# Patient Record
Sex: Female | Born: 1970 | Race: Black or African American | Hispanic: No | Marital: Single | Smoking: Never smoker
Health system: Southern US, Community
[De-identification: ages and names within clinical notes are randomized; demographics above are authoritative.]

## PROBLEM LIST (undated history)

## (undated) DIAGNOSIS — I10 Essential (primary) hypertension: Secondary | ICD-10-CM

## (undated) DIAGNOSIS — I7100 Dissection of unspecified site of aorta: Secondary | ICD-10-CM

## (undated) HISTORY — PX: CARDIAC SURGERY: SHX584

## (undated) HISTORY — PX: LAPAROSCOPIC GASTRIC SLEEVE RESECTION: SHX5895

## (undated) HISTORY — PX: FOOT SURGERY: SHX648

---

## 1998-02-04 ENCOUNTER — Other Ambulatory Visit: Admission: RE | Admit: 1998-02-04 | Discharge: 1998-02-04 | Payer: Self-pay | Admitting: *Deleted

## 1999-09-28 ENCOUNTER — Other Ambulatory Visit: Admission: RE | Admit: 1999-09-28 | Discharge: 1999-09-28 | Payer: Self-pay | Admitting: Obstetrics & Gynecology

## 1999-10-19 ENCOUNTER — Inpatient Hospital Stay (HOSPITAL_COMMUNITY): Admission: AD | Admit: 1999-10-19 | Discharge: 1999-10-19 | Payer: Self-pay | Admitting: Obstetrics & Gynecology

## 1999-10-19 ENCOUNTER — Encounter: Payer: Self-pay | Admitting: Obstetrics & Gynecology

## 2000-06-19 ENCOUNTER — Emergency Department (HOSPITAL_COMMUNITY): Admission: EM | Admit: 2000-06-19 | Discharge: 2000-06-19 | Payer: Self-pay | Admitting: Emergency Medicine

## 2001-02-13 ENCOUNTER — Other Ambulatory Visit: Admission: RE | Admit: 2001-02-13 | Discharge: 2001-02-13 | Payer: Self-pay | Admitting: *Deleted

## 2001-09-07 ENCOUNTER — Emergency Department (HOSPITAL_COMMUNITY): Admission: EM | Admit: 2001-09-07 | Discharge: 2001-09-07 | Payer: Self-pay

## 2004-10-07 ENCOUNTER — Emergency Department: Payer: Self-pay | Admitting: Unknown Physician Specialty

## 2006-06-19 ENCOUNTER — Emergency Department: Payer: Self-pay | Admitting: Emergency Medicine

## 2007-01-31 ENCOUNTER — Emergency Department: Payer: Self-pay | Admitting: Emergency Medicine

## 2009-01-09 ENCOUNTER — Emergency Department: Payer: Self-pay | Admitting: Unknown Physician Specialty

## 2009-01-23 ENCOUNTER — Emergency Department: Payer: Self-pay | Admitting: Emergency Medicine

## 2016-03-30 ENCOUNTER — Emergency Department: Payer: Self-pay

## 2016-03-30 ENCOUNTER — Encounter: Payer: Self-pay | Admitting: Emergency Medicine

## 2016-03-30 ENCOUNTER — Emergency Department
Admission: EM | Admit: 2016-03-30 | Discharge: 2016-03-30 | Payer: Self-pay | Attending: Emergency Medicine | Admitting: Emergency Medicine

## 2016-03-30 DIAGNOSIS — I10 Essential (primary) hypertension: Secondary | ICD-10-CM | POA: Insufficient documentation

## 2016-03-30 DIAGNOSIS — Z79899 Other long term (current) drug therapy: Secondary | ICD-10-CM | POA: Insufficient documentation

## 2016-03-30 DIAGNOSIS — I7103 Dissection of thoracoabdominal aorta: Secondary | ICD-10-CM | POA: Insufficient documentation

## 2016-03-30 HISTORY — DX: Essential (primary) hypertension: I10

## 2016-03-30 LAB — CBC
HCT: 37.1 % (ref 35.0–47.0)
Hemoglobin: 12.6 g/dL (ref 12.0–16.0)
MCH: 30 pg (ref 26.0–34.0)
MCHC: 34 g/dL (ref 32.0–36.0)
MCV: 88.2 fL (ref 80.0–100.0)
Platelets: 169 10*3/uL (ref 150–440)
RBC: 4.21 MIL/uL (ref 3.80–5.20)
RDW: 14 % (ref 11.5–14.5)
WBC: 8.1 10*3/uL (ref 3.6–11.0)

## 2016-03-30 LAB — COMPREHENSIVE METABOLIC PANEL
ALK PHOS: 68 U/L (ref 38–126)
ALT: 13 U/L — AB (ref 14–54)
AST: 20 U/L (ref 15–41)
Albumin: 3.6 g/dL (ref 3.5–5.0)
Anion gap: 7 (ref 5–15)
BILIRUBIN TOTAL: 0.5 mg/dL (ref 0.3–1.2)
BUN: 17 mg/dL (ref 6–20)
CALCIUM: 8.8 mg/dL — AB (ref 8.9–10.3)
CO2: 27 mmol/L (ref 22–32)
CREATININE: 0.83 mg/dL (ref 0.44–1.00)
Chloride: 105 mmol/L (ref 101–111)
Glucose, Bld: 119 mg/dL — ABNORMAL HIGH (ref 65–99)
Potassium: 3.4 mmol/L — ABNORMAL LOW (ref 3.5–5.1)
Sodium: 139 mmol/L (ref 135–145)
TOTAL PROTEIN: 7.2 g/dL (ref 6.5–8.1)

## 2016-03-30 LAB — TROPONIN I

## 2016-03-30 MED ORDER — IOPAMIDOL (ISOVUE-370) INJECTION 76%
125.0000 mL | Freq: Once | INTRAVENOUS | Status: AC | PRN
  Administered 2016-03-30: 125 mL via INTRAVENOUS

## 2016-03-30 MED ORDER — ONDANSETRON HCL 4 MG/2ML IJ SOLN
INTRAMUSCULAR | Status: AC
  Administered 2016-03-30: 4 mg via INTRAVENOUS
  Filled 2016-03-30: qty 2

## 2016-03-30 MED ORDER — NITROPRUSSIDE SODIUM 25 MG/ML IV SOLN
0.0000 ug/kg/min | Freq: Once | INTRAVENOUS | Status: AC
  Administered 2016-03-30: 0.3 ug/kg/min via INTRAVENOUS
  Filled 2016-03-30: qty 2

## 2016-03-30 MED ORDER — MORPHINE SULFATE (PF) 2 MG/ML IV SOLN
2.0000 mg | Freq: Once | INTRAVENOUS | Status: AC
  Administered 2016-03-30: 2 mg via INTRAVENOUS

## 2016-03-30 MED ORDER — ONDANSETRON HCL 4 MG/2ML IJ SOLN
4.0000 mg | Freq: Once | INTRAMUSCULAR | Status: AC
  Administered 2016-03-30: 4 mg via INTRAVENOUS

## 2016-03-30 MED ORDER — ESMOLOL HCL-SODIUM CHLORIDE 2000 MG/100ML IV SOLN
25.0000 ug/kg/min | Freq: Once | INTRAVENOUS | Status: AC
  Administered 2016-03-30: 25 ug/kg/min via INTRAVENOUS
  Filled 2016-03-30: qty 100

## 2016-03-30 MED ORDER — MORPHINE SULFATE (PF) 4 MG/ML IV SOLN
INTRAVENOUS | Status: AC
  Administered 2016-03-30: 4 mg via INTRAVENOUS
  Filled 2016-03-30: qty 1

## 2016-03-30 MED ORDER — IOPAMIDOL (ISOVUE-300) INJECTION 61%
30.0000 mL | Freq: Once | INTRAVENOUS | Status: AC | PRN
  Administered 2016-03-30: 30 mL via ORAL

## 2016-03-30 MED ORDER — MORPHINE SULFATE (PF) 4 MG/ML IV SOLN
4.0000 mg | Freq: Once | INTRAVENOUS | Status: AC
  Administered 2016-03-30: 4 mg via INTRAVENOUS

## 2016-03-30 MED ORDER — HYDROMORPHONE HCL 1 MG/ML IJ SOLN
0.5000 mg | Freq: Once | INTRAMUSCULAR | Status: AC
  Administered 2016-03-30: 0.5 mg via INTRAVENOUS
  Filled 2016-03-30: qty 1

## 2016-03-30 MED ORDER — MORPHINE SULFATE (PF) 2 MG/ML IV SOLN
INTRAVENOUS | Status: AC
  Administered 2016-03-30: 2 mg via INTRAVENOUS
  Filled 2016-03-30: qty 1

## 2016-03-30 NOTE — ED Triage Notes (Signed)
Patient from home via [facility]. Patient reports chest pain that started after walking dog. Reports pain is now radiating to back and abdomen. Patient denies N/V, SOB, dizziness.  Reports pain improves when sitting up. Denies history of same. Patient A&O x4.

## 2016-03-30 NOTE — ED Notes (Signed)
Call light answered. Pt stated she was in pain and throwing up. RN notified.

## 2016-03-30 NOTE — ED Provider Notes (Signed)
[facility] Emergency Department Provider Note   ____________________________________________    I have reviewed the triage vital signs and the nursing notes.   HISTORY  Chief Complaint Chest Pain     HPI Julie Bush is a 46 y.o. female who presents with complaints of chest discomfort. Patient reports at approximately 5 AM this morning she felt a strange sensation in her mouth and then a rolling sensation down through her chest and into her abdomen which she describes as very uncomfortable. She denies nausea or vomiting. She denies short of breath. She denies lower extremity swelling or calf pain or edema. No recent travel. No back pain. She has never had this before.   Past Medical History:  Diagnosis Date  . Hypertension     There are no active problems to display for this patient.   Past Surgical History:  Procedure Laterality Date  . FOOT SURGERY      Prior to Admission medications   Medication Sig Start Date End Date Taking? Authorizing Provider  amLODipine (NORVASC) 10 MG tablet Take 10 mg by mouth daily.   Yes Historical Provider, MD  hydrochlorothiazide (HYDRODIURIL) 25 MG tablet Take 25 mg by mouth daily.   Yes Historical Provider, MD     Allergies Patient has no known allergies.  No family history on file.  Social History Social History  Substance Use Topics  . Smoking status: Never Smoker  . Smokeless tobacco: Never Used  . Alcohol use No    Review of Systems  Constitutional: No fever/chills Eyes: No visual changes.  ENT: No sore throat. Cardiovascular: As above Respiratory: Denies shortness of breath. Gastrointestinal:  No nausea, no vomiting.    Musculoskeletal: Negative for back pain. Skin: Negative for rash. Neurological: Negative for headaches   10-point ROS otherwise negative.  ____________________________________________   PHYSICAL EXAM:  VITAL SIGNS: ED Triage Vitals [03/30/16 0717]  Enc  Vitals Group     BP      Pulse      Resp      Temp      Temp src      SpO2      Weight (!) 345 lb (156.5 kg)     Height 5\' 6"  (1.676 m)     Head Circumference      Peak Flow      Pain Score 10     Pain Loc      Pain Edu?      Excl. in GC?     Constitutional: Alert and oriented. No acute distress. Pleasant and interactive Eyes: Conjunctivae are normal.  Head: Atraumatic. Marland Kitchen.Mouth/Throat: Mucous membranes are moist.    Cardiovascular: Normal rate, regular rhythm. Grossly normal heart sounds.  Good peripheral circulation.No chest wall tenderness to palpation Respiratory: Normal respiratory effort.  No retractions. Lungs CTAB. Gastrointestinal: Soft and nontender. No distention.  No CVA tenderness. Genitourinary: deferred Musculoskeletal: No lower extremity tenderness nor edema.  Warm and well perfused Neurologic:  Normal speech and language. No gross focal neurologic deficits are appreciated.  Skin:  Skin is warm, dry and intact. No rash noted. Psychiatric: Mood and affect are normal. Speech and behavior are normal.  ____________________________________________   LABS (all labs ordered are listed, but only abnormal results are displayed)  Labs Reviewed  COMPREHENSIVE METABOLIC PANEL - Abnormal; Notable for the following:       Result Value   Potassium 3.4 (*)    Glucose, Bld 119 (*)    Calcium 8.8 (*)  ALT 13 (*)    All other components within normal limits  CBC  TROPONIN I   ____________________________________________  EKG  ED ECG REPORT I, Jene Every, the attending physician, personally viewed and interpreted this ECG.  Date: 03/30/2016 EKG Time: 7:20 AM Rate: 72 Rhythm: normal sinus rhythm QRS Axis: normal Intervals: normal ST/T Wave abnormalities: normal Conduction Disturbances: none   ____________________________________________  RADIOLOGY  CT angio shows ascending/descending aortic  dissection ____________________________________________   PROCEDURES  Procedure(s) performed: No    Critical Care performed: yes  CRITICAL CARE Performed by: Jene Every   Total critical care time:40 minutes  Critical care time was exclusive of separately billable procedures and treating other patients.  Critical care was necessary to treat or prevent imminent or life-threatening deterioration.  Critical care was time spent personally by me on the following activities: development of treatment plan with patient and/or surrogate as well as nursing, discussions with consultants, evaluation of patient's response to treatment, examination of patient, obtaining history from patient or surrogate, ordering and performing treatments and interventions, ordering and review of laboratory studies, ordering and review of radiographic studies, pulse oximetry and re-evaluation of patient's condition.  ____________________________________________   INITIAL IMPRESSION / ASSESSMENT AND PLAN / ED COURSE  Pertinent labs & imaging results that were available during my care of the patient were reviewed by me and considered in my medical decision making (see chart for details).  Patient overall well-appearing and in no acute distress. History of present illness does not appear consistent with ACS/myocarditis/PE/dissection. EKG is reassuring, we will check labs, chest x-ray and reevaluate.  Clinical Course   Lab work is unremarkable but patient continues to require pain medication and appears uncomfortable.  We will obtain CT imaging.  ----------------------------------------- 1:02 PM on 03/30/2016 -----------------------------------------  Discussed with Dr. Margarita Grizzle of radiology as on wet read there is a clear dissection. He confirms aortic dissection.  Patient's BP is elevated but HR is normal. Nipride drip ordered. Additional pain medications ordered.Goal blood pressure 120  systolic  [facility] transfer center contacted  ----------------------------------------- 1:21 PM on 03/30/2016 -----------------------------------------  Heart rate now slightly elevated in the 70s and 80s, we'll start esmolol with goal of 60  Accepted by Dr. Margo Aye of [facility]  Patient aware of diagnosis, severity and plan ____________________________________________   FINAL CLINICAL IMPRESSION(S) / ED DIAGNOSES  Final diagnoses:  Aortic dissection, thoracoabdominal (HCC)      NEW MEDICATIONS STARTED DURING THIS VISIT:  New Prescriptions   No medications on file     Note:  This document was prepared using Dragon voice recognition software and may include unintentional dictation errors.    Jene Every, MD 03/30/16 1322

## 2016-07-23 DIAGNOSIS — L910 Hypertrophic scar: Secondary | ICD-10-CM | POA: Insufficient documentation

## 2016-09-20 DIAGNOSIS — E785 Hyperlipidemia, unspecified: Secondary | ICD-10-CM | POA: Insufficient documentation

## 2016-09-20 DIAGNOSIS — R0989 Other specified symptoms and signs involving the circulatory and respiratory systems: Secondary | ICD-10-CM | POA: Insufficient documentation

## 2017-01-23 ENCOUNTER — Encounter: Payer: Self-pay | Admitting: Critical Care Medicine

## 2017-01-23 DIAGNOSIS — I10 Essential (primary) hypertension: Secondary | ICD-10-CM | POA: Insufficient documentation

## 2017-01-23 DIAGNOSIS — I7100 Dissection of unspecified site of aorta: Secondary | ICD-10-CM | POA: Insufficient documentation

## 2019-10-19 ENCOUNTER — Emergency Department: Payer: BLUE CROSS/BLUE SHIELD

## 2019-10-19 ENCOUNTER — Encounter: Payer: Self-pay | Admitting: Emergency Medicine

## 2019-10-19 DIAGNOSIS — Z20822 Contact with and (suspected) exposure to covid-19: Secondary | ICD-10-CM | POA: Diagnosis not present

## 2019-10-19 DIAGNOSIS — R55 Syncope and collapse: Secondary | ICD-10-CM | POA: Diagnosis not present

## 2019-10-19 DIAGNOSIS — I10 Essential (primary) hypertension: Secondary | ICD-10-CM | POA: Diagnosis not present

## 2019-10-19 DIAGNOSIS — R109 Unspecified abdominal pain: Secondary | ICD-10-CM | POA: Diagnosis not present

## 2019-10-19 DIAGNOSIS — Z79899 Other long term (current) drug therapy: Secondary | ICD-10-CM | POA: Diagnosis not present

## 2019-10-19 DIAGNOSIS — N3001 Acute cystitis with hematuria: Secondary | ICD-10-CM | POA: Diagnosis not present

## 2019-10-19 DIAGNOSIS — R42 Dizziness and giddiness: Secondary | ICD-10-CM | POA: Diagnosis not present

## 2019-10-19 DIAGNOSIS — R079 Chest pain, unspecified: Secondary | ICD-10-CM | POA: Diagnosis not present

## 2019-10-19 LAB — CBC
HCT: 36.6 % (ref 36.0–46.0)
Hemoglobin: 11.9 g/dL — ABNORMAL LOW (ref 12.0–15.0)
MCH: 29.7 pg (ref 26.0–34.0)
MCHC: 32.5 g/dL (ref 30.0–36.0)
MCV: 91.3 fL (ref 80.0–100.0)
Platelets: 197 10*3/uL (ref 150–400)
RBC: 4.01 MIL/uL (ref 3.87–5.11)
RDW: 12.3 % (ref 11.5–15.5)
WBC: 9.3 10*3/uL (ref 4.0–10.5)
nRBC: 0 % (ref 0.0–0.2)

## 2019-10-19 LAB — BASIC METABOLIC PANEL
Anion gap: 10 (ref 5–15)
BUN: 32 mg/dL — ABNORMAL HIGH (ref 6–20)
CO2: 26 mmol/L (ref 22–32)
Calcium: 9 mg/dL (ref 8.9–10.3)
Chloride: 101 mmol/L (ref 98–111)
Creatinine, Ser: 1.18 mg/dL — ABNORMAL HIGH (ref 0.44–1.00)
GFR calc Af Amer: >60 mL/min (ref >60)
GFR calc non Af Amer: 54 mL/min — ABNORMAL LOW (ref >60)
Glucose, Bld: 119 mg/dL — ABNORMAL HIGH (ref 70–99)
Potassium: 4.2 mmol/L (ref 3.5–5.1)
Sodium: 137 mmol/L (ref 135–145)

## 2019-10-19 LAB — TROPONIN I (HIGH SENSITIVITY): Troponin I (High Sensitivity): 6 ng/L (ref <18)

## 2019-10-19 MED ORDER — IOHEXOL 350 MG/ML SOLN
125.0000 mL | Freq: Once | INTRAVENOUS | Status: AC | PRN
  Administered 2019-10-19: 125 mL via INTRAVENOUS

## 2019-10-19 NOTE — ED Triage Notes (Signed)
Patient reports low back pain and syncopal episode today. patient reports similar occurrence in 2018 when she had an aortic dissection.   Patient also c/o N/V, medical chest pain, light-headedness.

## 2019-10-20 ENCOUNTER — Observation Stay
Admission: EM | Admit: 2019-10-20 | Discharge: 2019-10-21 | Disposition: A | Discharge status: Home / Self Care | Payer: BLUE CROSS/BLUE SHIELD | Attending: Internal Medicine | Admitting: Internal Medicine

## 2019-10-20 ENCOUNTER — Encounter: Payer: Self-pay | Admitting: Internal Medicine

## 2019-10-20 ENCOUNTER — Other Ambulatory Visit: Payer: Self-pay

## 2019-10-20 ENCOUNTER — Observation Stay
Admit: 2019-10-20 | Discharge: 2019-10-20 | Disposition: A | Discharge status: Home / Self Care | Payer: BLUE CROSS/BLUE SHIELD | Attending: Internal Medicine | Admitting: Internal Medicine

## 2019-10-20 ENCOUNTER — Observation Stay: Payer: BLUE CROSS/BLUE SHIELD

## 2019-10-20 DIAGNOSIS — N3001 Acute cystitis with hematuria: Secondary | ICD-10-CM

## 2019-10-20 DIAGNOSIS — Z8679 Personal history of other diseases of the circulatory system: Secondary | ICD-10-CM

## 2019-10-20 DIAGNOSIS — R55 Syncope and collapse: Secondary | ICD-10-CM | POA: Diagnosis not present

## 2019-10-20 HISTORY — DX: Dissection of unspecified site of aorta: I71.00

## 2019-10-20 LAB — HCG, QUANTITATIVE, PREGNANCY: hCG, Beta Chain, Quant, S: 1 m[IU]/mL (ref <5)

## 2019-10-20 LAB — URINALYSIS, COMPLETE (UACMP) WITH MICROSCOPIC
Bilirubin Urine: NEGATIVE
Glucose, UA: NEGATIVE mg/dL
Hgb urine dipstick: NEGATIVE
Ketones, ur: NEGATIVE mg/dL
Nitrite: NEGATIVE
Protein, ur: NEGATIVE mg/dL
Specific Gravity, Urine: 1.038 — ABNORMAL HIGH (ref 1.005–1.030)
pH: 6 (ref 5.0–8.0)

## 2019-10-20 LAB — POCT PREGNANCY, URINE: Preg Test, Ur: NEGATIVE

## 2019-10-20 LAB — GLUCOSE, CAPILLARY: Glucose-Capillary: 130 mg/dL — ABNORMAL HIGH (ref 70–99)

## 2019-10-20 LAB — SARS CORONAVIRUS 2 BY RT PCR (HOSPITAL ORDER, PERFORMED IN ~~LOC~~ HOSPITAL LAB): SARS Coronavirus 2: NEGATIVE

## 2019-10-20 LAB — TROPONIN I (HIGH SENSITIVITY): Troponin I (High Sensitivity): 35 ng/L — ABNORMAL HIGH (ref <18)

## 2019-10-20 MED ORDER — IOHEXOL 350 MG/ML SOLN
75.0000 mL | Freq: Once | INTRAVENOUS | Status: AC | PRN
  Administered 2019-10-20: 75 mL via INTRAVENOUS

## 2019-10-20 MED ORDER — SPIRONOLACTONE 25 MG PO TABS
25.0000 mg | ORAL_TABLET | Freq: Two times a day (BID) | ORAL | Status: DC
  Administered 2019-10-20 – 2019-10-21 (×3): 25 mg via ORAL
  Filled 2019-10-20 (×3): qty 1

## 2019-10-20 MED ORDER — METFORMIN HCL ER 500 MG PO TB24
1000.0000 mg | ORAL_TABLET | Freq: Every evening | ORAL | Status: DC
  Administered 2019-10-20: 1000 mg via ORAL
  Filled 2019-10-20 (×2): qty 2

## 2019-10-20 MED ORDER — ENOXAPARIN SODIUM 40 MG/0.4ML ~~LOC~~ SOLN
40.0000 mg | Freq: Two times a day (BID) | SUBCUTANEOUS | Status: DC
  Administered 2019-10-20 – 2019-10-21 (×3): 40 mg via SUBCUTANEOUS
  Filled 2019-10-20 (×3): qty 0.4

## 2019-10-20 MED ORDER — SERTRALINE HCL 50 MG PO TABS
100.0000 mg | ORAL_TABLET | Freq: Every day | ORAL | Status: DC
  Administered 2019-10-20 – 2019-10-21 (×2): 100 mg via ORAL
  Filled 2019-10-20 (×2): qty 2

## 2019-10-20 MED ORDER — VITAMIN D 25 MCG (1000 UNIT) PO TABS
1000.0000 [IU] | ORAL_TABLET | Freq: Every day | ORAL | Status: DC
  Administered 2019-10-20 – 2019-10-21 (×2): 1000 [IU] via ORAL
  Filled 2019-10-20 (×2): qty 1

## 2019-10-20 MED ORDER — SODIUM CHLORIDE 0.9 % IV SOLN
1.0000 g | Freq: Once | INTRAVENOUS | Status: AC
  Administered 2019-10-20: 1 g via INTRAVENOUS
  Filled 2019-10-20: qty 10

## 2019-10-20 MED ORDER — SODIUM CHLORIDE 0.9 % IV SOLN: INTRAVENOUS | Status: DC

## 2019-10-20 MED ORDER — ATORVASTATIN CALCIUM 20 MG PO TABS
40.0000 mg | ORAL_TABLET | Freq: Every evening | ORAL | Status: DC
  Administered 2019-10-20: 40 mg via ORAL
  Filled 2019-10-20: qty 2

## 2019-10-20 MED ORDER — AMLODIPINE BESYLATE 10 MG PO TABS
10.0000 mg | ORAL_TABLET | Freq: Every day | ORAL | Status: DC
  Administered 2019-10-20 – 2019-10-21 (×2): 10 mg via ORAL
  Filled 2019-10-20 (×2): qty 1

## 2019-10-20 MED ORDER — CARVEDILOL 25 MG PO TABS
25.0000 mg | ORAL_TABLET | Freq: Two times a day (BID) | ORAL | Status: DC
  Administered 2019-10-20 – 2019-10-21 (×3): 25 mg via ORAL
  Filled 2019-10-20 (×3): qty 1

## 2019-10-20 MED ORDER — IRBESARTAN 150 MG PO TABS
75.0000 mg | ORAL_TABLET | Freq: Every day | ORAL | Status: DC
  Administered 2019-10-20 – 2019-10-21 (×2): 75 mg via ORAL
  Filled 2019-10-20 (×2): qty 1

## 2019-10-20 MED ORDER — ACETAMINOPHEN 500 MG PO TABS
1000.0000 mg | ORAL_TABLET | Freq: Once | ORAL | Status: AC
  Administered 2019-10-20: 1000 mg via ORAL
  Filled 2019-10-20: qty 2

## 2019-10-20 MED ORDER — SODIUM CHLORIDE 0.9% FLUSH
3.0000 mL | Freq: Two times a day (BID) | INTRAVENOUS | Status: DC
  Administered 2019-10-20 (×2): 3 mL via INTRAVENOUS

## 2019-10-20 NOTE — Progress Notes (Addendum)
Patient seen and examined.  No complaints at this time.  She is morbidly obese otherwise physical exam is unremarkable. Vitals are stable.  Continue to monitor.  CTA head and neck and 2D echo are pending.  Hydrate with IV fluids for possible dehydration.  Monitor BMP.  Follow-up with cardiologist.  Plan discussed with patient and her daughter at the bedside

## 2019-10-20 NOTE — ED Notes (Signed)
Pt transported to room 231

## 2019-10-20 NOTE — Consult Note (Addendum)
Cardiology Consultation:   Patient ID: Julie Bush; [id number]; 1970-07-28   Admit date: 10/20/2019 Date of Consult: 10/20/2019  Primary Care Provider: Patient, No Pcp Per Primary Cardiologist: [facility] Primary Electrophysiologist:  None   Patient Profile:   Julie Bush is a 49 y.o. 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 Para March.  History of Present Illness:   Julie Bush is followed by [facility] cardiology.  She underwent surgical repair of type A dissection which extended superiorly into the brachiocephalic artery in 03/2016.  She underwent PET Myoview in 05/2018 for , DOE which was abnormal.  This led to a diagnostic cath in 05/2018 which showed no significant CAD with an LVEDP of 14.  Zio patch monitoring in 02/2019 showed a predominant sinus rhythm with an average rate of 76 bpm with rare PACs and PVCs.  No significant arrhythmia was noted.  Most recent echo from 04/2019 showed normal EF, mitral annular calcification, aortic valve sclerosis, and mild pulmonary hypertension.  She was most recently seen by her [facility] cardiologist in 07/2019 for follow-up of hypertension following addition of valsartan and was doing well.  She was in her usual state of health up until 2 days prior when she developed some back discomfort.  Initially, she attributed this to musculoskeletal etiology from bending over to pick up her dog.  Pain improved with Tylenol and ibuprofen.  She was out at a restaurant on 7/23 celebrating a friend's birthday when she began to feel like the room was closing in on her with symptoms described as spacing out.  There was some associated clamminess and diaphoresis.  She reported to a friend that she  did not feel well.  She vomited.  This was followed by a syncopal episode with bowel incontinence.  No preceding angina or palpitations.  She was brought to the hospital by EMS.  She had stable vital signs.  EKG nonacute as below.  CTA of the chest/abdomen/pelvis showed a stable type a thoracic aortic dissection which extended to involve the origin of the brachiocephalic artery, length of the abdominal aorta, and left common iliac and proximal portion of the left external iliac which was previously noted on her imaging.  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  Initial high-sensitivity troponin VI with a delta of 35.  hCG negative.  Labs consistent with some degree of dehydration with BUN of 32 and serum creatinine 1.18.  Hemoglobin 11.9.  Telemetry has revealed sinus rhythm.  She currently feels well and is at her baseline.    Past Medical History:  Diagnosis Date   Aortic dissection (HCC)    Hypertension     Past Surgical History:  Procedure Laterality Date   CARDIAC SURGERY     FOOT SURGERY     LAPAROSCOPIC GASTRIC SLEEVE RESECTION       Home Meds: Prior to Admission medications   Medication Sig Start Date End Date Taking? Authorizing Provider  amLODipine (NORVASC) 10 MG tablet Take 10 mg by mouth daily.   Yes [provider]  atorvastatin (LIPITOR) 40 MG tablet Take 40 mg by mouth every evening. 09/06/19  Yes [provider]  carvedilol (COREG) 25 MG tablet Take 25 mg by mouth 2 (two)  times daily. 08/04/19  Yes [provider]  cholecalciferol (VITAMIN D3) 25 MCG (1000 UNIT) tablet Take 1,000 Units by mouth daily.   Yes [provider]  metFORMIN (GLUCOPHAGE-XR) 500 MG 24 hr tablet Take 1,000 mg by mouth every evening. 10/11/19  Yes [provider]  Multiple Vitamins-Minerals (BARIATRIC MULTIVITAMINS/IRON PO) Take 1 tablet by mouth daily.   Yes [provider]  sertraline (ZOLOFT) 100 MG tablet Take 100 mg by mouth  daily. 09/13/19  Yes [provider]  spironolactone (ALDACTONE) 25 MG tablet Take 25 mg by mouth 2 (two) times daily. 08/04/19  Yes [provider]  valsartan (DIOVAN) 80 MG tablet Take 80 mg by mouth daily. 10/06/19  Yes [provider]    Inpatient Medications: Scheduled Meds:  enoxaparin (LOVENOX) injection  40 mg Subcutaneous Q12H   sodium chloride flush  3 mL Intravenous Q12H   Continuous Infusions:   PRN Meds:   Allergies:   Allergies  Allergen Reactions   Hydrochlorothiazide Rash    Social History:   Social History   Socioeconomic History   Marital status: Single    Spouse name: Not on file   Number of children: Not on file   Years of education: Not on file   Highest education level: Not on file  Occupational History   Not on file  Tobacco Use   Smoking status: Never Smoker   Smokeless tobacco: Never Used  Substance and Sexual Activity   Alcohol use: No   Drug use: No   Sexual activity: Not Currently  Other Topics Concern   Not on file  Social History Narrative   Not on file   Social Determinants of Health   Financial Resource Strain:    Difficulty of Paying Living Expenses:   Food Insecurity:    Worried About Programme researcher, broadcasting/film/video in the Last Year:    Barista in the Last Year:   Transportation Needs:    Freight forwarder (Medical):    Lack of Transportation (Non-Medical):   Physical Activity:    Days of Exercise per Week:    Minutes of Exercise per Session:   Stress:    Feeling of Stress :   Social Connections:    Frequency of Communication with Friends and Family:    Frequency of Social Gatherings with Friends and Family:    Attends Religious Services:    Active Member of Clubs or Organizations:    Attends Engineer, structural:    Marital Status:   Intimate Partner Violence:    Fear of Current or Ex-Partner:    Emotionally Abused:    Physically Abused:    Sexually Abused:      Family History:    Family History  Problem Relation Age of Onset   Diabetes Mother    Hypertension Mother    Obesity Mother    Transient ischemic attack Father    Heart disease Father    Hypertension Father    Stroke Father    Melanoma Paternal Grandfather     ROS:  Review of Systems  Constitutional: Positive for diaphoresis and malaise/fatigue. Negative for chills, fever and weight loss.  HENT: Negative for congestion.   Eyes: Negative for discharge and redness.  Respiratory: Negative for cough, sputum production, shortness of breath and wheezing.   Cardiovascular: Negative for chest pain, palpitations, orthopnea, claudication, leg swelling and PND.  Gastrointestinal: Negative for abdominal pain, heartburn, nausea and vomiting.  Musculoskeletal: Positive for back pain.  Negative for falls and myalgias.  Skin: Negative for rash.  Neurological: Positive for loss of consciousness and weakness. Negative for dizziness, tingling, tremors, sensory change, speech change, focal weakness and seizures.  Endo/Heme/Allergies: Does not bruise/bleed easily.  Psychiatric/Behavioral: Negative for substance abuse. The patient is not nervous/anxious.   All other systems reviewed and are negative.     Physical Exam/Data:   Vitals:   10/20/19 0315 10/20/19 0403 10/20/19 0731 10/20/19 0808  BP: 107/67 (!) 123/62 123/67 126/72  Pulse: 79 85 78 74  Resp: 16 17 22 18   Temp:  98 F (36.7 C) 97.6 F (36.4 C) 97.9 F (36.6 C)  TempSrc:  Oral Oral Oral  SpO2: 98% 99% 99% 99%  Weight:    (!) 156.5 kg  Height:    5\' 6"  (1.676 m)    Intake/Output Summary (Last 24 hours) at 10/20/2019 0944 Last data filed at 10/20/2019 0815 Gross per 24 hour  Intake --  Output 1200 ml  Net -1200 ml   Filed Weights   10/19/19 2150 10/20/19 0808  Weight: (!) 156.5 kg (!) 156.5 kg   Body mass index is 55.7 kg/m.   Physical Exam: General: Well developed, well nourished, in no acute distress. Head: Normocephalic, atraumatic,  sclera non-icteric, no xanthomas, nares without discharge.  Neck: Negative for carotid bruits with S4 gallop noted. JVD not elevated. Lungs: Clear bilaterally to auscultation without wheezes, rales, or rhonchi. Breathing is unlabored. Heart: RRR with S1 S2. No murmurs, rubs, or gallops appreciated. Abdomen: Soft, non-tender, non-distended with normoactive bowel sounds. No hepatomegaly. No rebound/guarding. No obvious abdominal masses. Msk:  Strength and tone appear normal for age. Extremities: No clubbing or cyanosis. No edema. Distal pedal pulses are 2+ and equal bilaterally. Neuro: Alert and oriented X 3. No facial asymmetry. No focal deficit. Moves all extremities spontaneously. Psych:  Responds to questions appropriately with a normal affect.   EKG:  The EKG was personally reviewed and demonstrates: NSR, 65 bpm, poor R wave progression along the precordial leads, nonspecific anterolateral ST-T changes Telemetry:  Telemetry was personally reviewed and demonstrates: SR with rare isolated PVC  Weights: [facility] Weights   10/19/19 2150 10/20/19 0808  Weight: (!) 156.5 kg (!) 156.5 kg    Relevant CV Studies: Cath / PCI:  3/20: No significant CAD. LVEDP 14   CV Surgery:  1/18: Replacement of ascending aorta and proximal hemi-arch with selective antegrade cerebral perfusion.    EP Procedures and Devices:  None   Non-Invasive Evaluation(s):  TTE 05/10/2019: Normal EF, mitral annular calcification, aortic sclerosis, mild pulmonary hypertension  Zio patch 02/2019: Predominantly sinus rhythm, average heart rate 76 bpm. Rare PACs and PVCs  PET myocardial perfusion study 3/20: Abnormal  CTA 2/20: Stable findings following dissection repair  TTE 10/10/2017: Normal EF, grade 1 diastolic dysfunction, mild RVE, mild TR  ETT 9/18: Ramped Bruce protocol normal   Laboratory Data:  Chemistry Recent Labs  Lab 10/19/19 2154  NA 137  K 4.2  CL 101  CO2 26  GLUCOSE 119*  BUN 32*    CREATININE 1.18*  CALCIUM 9.0  GFRNONAA 54*  GFRAA >60  ANIONGAP 10    No results for input(s): PROT, ALBUMIN, AST, ALT, ALKPHOS, BILITOT in the last 168 hours. Hematology Recent Labs  Lab 10/19/19 2154  WBC 9.3  RBC 4.01  HGB 11.9*  HCT 36.6  MCV 91.3  MCH 29.7  MCHC 32.5  RDW 12.3  PLT 197   Cardiac EnzymesNo results for input(s): TROPONINI  in the last 168 hours. No results for input(s): TROPIPOC in the last 168 hours.  BNPNo results for input(s): BNP, PROBNP in the last 168 hours.  DDimer No results for input(s): DDIMER in the last 168 hours.  Radiology/Studies:  DG Chest 2 View  Result Date: 10/19/2019 IMPRESSION: No active cardiopulmonary disease. Electronically Signed   By: Helyn NumbersAshesh  Parikh MD   On: 10/19/2019 22:22   CT Head Wo Contrast  Result Date: 10/19/2019 IMPRESSION: No acute intracranial pathology. Electronically Signed   By: Aram Candelahaddeus  Houston M.D.   On: 10/19/2019 23:53   CT Angio Chest/Abd/Pel for Dissection W and/or Wo Contrast  Result Date: 10/20/2019 IMPRESSION: 1. 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 This is seen on the prior study. 2. 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 3. Cholelithiasis without evidence of acute cholecystitis. 4. Stable areas of low attenuation within the right lobe of the liver which may represent hepatic hemangiomas. 5. IUD in place. 6. Multiple stable uterine fibroids. Aortic Atherosclerosis (ICD10-I70.0). Electronically Signed   By: Aram Candelahaddeus  Houston M.D.   On: 10/20/2019 00:09    Assessment and Plan:   1.  Syncope: -History of labs consistent with vasovagal/dehydration etiology -Check orthostatics -CTA chest/abdomen/pelvis shows stable anatomy -Given patient's history of type A aortic  dissection which extended superiorly into the brachiocephalic artery recommend obtaining CTA of the head and neck for further evaluation -Check echo -Monitor on telemetry  2.  Elevated troponin: -Minimally elevated and likely in the setting of demand ischemia following the patient's syncopal episode -Recent LHC in 05/2018 showed no significant CAD -Not consistent with ACS -No indication for heparin drip -Check echo as above  3.  Type A aortic dissection: -Status post surgical repair in 03/2016 -Stable on CTA chest/abdomen/pelvis this admission -[facility]  4.  HTN: -Blood pressure well controlled  5.  AKI: -Cannot exclude some degree of dehydration -Gentle hydration   For questions or updates, please contact [facility] Please consult [email or web address] for contact info under Cardiology/STEMI.   Signed, Eula Listenyan Dunn, PA-C [facility] Pager: [phone] 10/20/2019, 9:44 AM  Patient examined chart reviewed discussed care with patient and PA Exam with overweight black female in no distress. ECG and telemetry with NSR no arrhythmias. S4 gallop with no murmur distant heart sounds. CTA intact dissection repair. Dissection does involve the right brachiocephalic but carotids and neck vessels not visualized. Coronary arteries have no calcium noted. Agree with clinical context consistent with vasovagal event. Also agree with echo and CTA of head and neck to further assess carotids Previous Zio monitor 02/2019 with no arrhythmias and previous cath March 2020 with no CAD so doubt significance of trivial troponin elevation. Ok to d/c home in am if echo and CTA of carotids ok. Etiology not clear Given young age may benefit from genetics consult with Sheryn BisonSuony Joseph. Does not have body habitus for connective tissue disease and no high risk family history   Charlton HawsPeter Aldonia Keeven MD [facility]

## 2019-10-20 NOTE — H&P (Addendum)
History and Physical    Julie Bush MGN:[id number] DOB: 1971-03-06 DOA: 10/20/2019  PCP: Patient, No Pcp Per   Patient coming from: Home  I have personally briefly reviewed patient's old medical records in [facility]  Chief Complaint: Syncope  HPI: Julie Bush is a 49 y.o. 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 She apparently slid onto the floor while seated at the table level restaurant. No reports of jerking movements.  She did have incontinence of stool.  None she stated that just prior to the incident she felt clammy and diaphoretic.  She states the next thing she knew was that she was lying on the floor and people were around she came around. She also said that in the 2 preceding days she felt low back pain associated with some lower retrosternal discomfort.  Denies nausea vomiting, abdominal pain. Denies cough fever or chills. ED Course: On arrival vitals were all within normal limits. Significant findings on blood work includes creatinine 1.18, hemoglobin 11.9, troponin normal at 4. CT aorta showed stable aneurysmal dilatation of the ascending thoracic aorta, stable type I thoracic aortic dissection similar to prior study. No other acute problems. Head CT with no acute disease. Observation requested due to concern for arrhythmias given patient's history. Review of Systems: As per HPI otherwise all other systems on review of systems negative. Obvious   Past Medical History:  Diagnosis Date  . Aortic dissection (HCC)   . Hypertension     Past Surgical History:  Procedure Laterality Date  . CARDIAC SURGERY    . FOOT SURGERY    . LAPAROSCOPIC GASTRIC SLEEVE RESECTION       reports that she has never smoked. She has never used smokeless tobacco. She reports that she does not drink alcohol and does not use  drugs.  No Known Allergies  History reviewed. No pertinent family history.    Prior to Admission medications   Medication Sig Start Date End Date Taking? Authorizing Provider  amLODipine (NORVASC) 10 MG tablet Take 10 mg by mouth daily.    [provider]  hydrochlorothiazide (HYDRODIURIL) 25 MG tablet Take 25 mg by mouth daily.    [provider]    Physical Exam: Vitals:   10/19/19 2150  BP: 111/69  Pulse: 76  Resp: 20  Temp: 98.5 F (36.9 C)  TempSrc: Oral  SpO2: 100%  Weight: (!) 156.5 kg  Height: 5\' 6"  (1.676 m)     Vitals:   10/19/19 2150  BP: 111/69  Pulse: 76  Resp: 20  Temp: 98.5 F (36.9 C)  TempSrc: Oral  SpO2: 100%  Weight: (!) 156.5 kg  Height: 5\' 6"  (1.676 m)      Constitutional: Alert and oriented x 3 . Not in any apparent distress HEENT:      Head: Normocephalic and atraumatic.         Eyes: PERLA, EOMI, Conjunctivae are normal. Sclera is non-icteric.       Mouth/Throat: Mucous membranes are moist.       Neck: Supple with no signs of meningismus. Cardiovascular: Regular rate and rhythm. No murmurs, gallops, or rubs. 2+ symmetrical distal pulses are present . No JVD. No LE edema Respiratory: Respiratory effort normal .Lungs sounds clear bilaterally. No wheezes, crackles, or rhonchi.  Gastrointestinal: Soft, non tender, and non distended with positive bowel sounds.  No rebound or guarding. Genitourinary: No CVA tenderness. Musculoskeletal: Nontender with normal range of motion in all extremities. No cyanosis, or erythema of extremities. Neurologic: Normal speech and language. Face is symmetric. Moving all extremities. No gross focal neurologic deficits . Skin: Skin is warm, dry.  No rash or ulcers Psychiatric: Mood and affect are normal Speech and behavior are normal   Labs on Admission: I have personally reviewed following labs and imaging studies  CBC: Recent Labs  Lab 10/19/19 2154  WBC 9.3  HGB 11.9*  HCT 36.6  MCV  91.3  PLT 197   Basic Metabolic Panel: Recent Labs  Lab 10/19/19 2154  NA 137  K 4.2  CL 101  CO2 26  GLUCOSE 119*  BUN 32*  CREATININE 1.18*  CALCIUM 9.0   GFR: Estimated Creatinine Clearance: 89.4 mL/min (A) (by C-G formula based on SCr of 1.18 mg/dL (H)). Liver Function Tests: No results for input(s): AST, ALT, ALKPHOS, BILITOT, PROT, ALBUMIN in the last 168 hours. No results for input(s): LIPASE, AMYLASE in the last 168 hours. No results for input(s): AMMONIA in the last 168 hours. Coagulation Profile: No results for input(s): INR, PROTIME in the last 168 hours. Cardiac Enzymes: No results for input(s): CKTOTAL, CKMB, CKMBINDEX, TROPONINI in the last 168 hours. BNP (last 3 results) No results for input(s): PROBNP in the last 8760 hours. HbA1C: No results for input(s): HGBA1C in the last 72 hours. CBG: No results for input(s): GLUCAP in the last 168 hours. Lipid Profile: No results for input(s): CHOL, HDL, LDLCALC, TRIG, CHOLHDL, LDLDIRECT in the last 72 hours. Thyroid Function Tests: No results for input(s): TSH, T4TOTAL, FREET4, T3FREE, THYROIDAB in the last 72 hours. Anemia Panel: No results for input(s): VITAMINB12, FOLATE, FERRITIN, TIBC, IRON, RETICCTPCT in the last 72 hours. Urine analysis: No results found for: COLORURINE, APPEARANCEUR, LABSPEC, PHURINE, GLUCOSEU, HGBUR, BILIRUBINUR, KETONESUR, PROTEINUR, UROBILINOGEN, NITRITE, LEUKOCYTESUR  Radiological Exams on Admission: DG Chest 2 View  Result Date: 10/19/2019 CLINICAL DATA:  Chest pain EXAM: CHEST - 2 VIEW COMPARISON:  03/30/2016 FINDINGS: Lungs volumes are small, but are symmetric and are clear. No pneumothorax or pleural effusion. Cardiac size within normal limits. Pulmonary vascularity is normal. Osseous structures are age-appropriate. No acute bone abnormality. Surgical clips are noted within the right axilla. IMPRESSION: No active cardiopulmonary disease. Electronically Signed   By: Helyn NumbersAshesh  Parikh MD    On: 10/19/2019 22:22   CT Head Wo Contrast  Result Date: 10/19/2019 CLINICAL DATA:  Lower back pain and syncopal episode. EXAM: CT HEAD WITHOUT CONTRAST TECHNIQUE: Contiguous axial images were obtained from the base of the skull through the vertex without intravenous contrast. COMPARISON:  October 07, 2004 FINDINGS: Brain: No evidence of acute infarction, hemorrhage, hydrocephalus, extra-axial collection or mass lesion/mass effect. Vascular: No hyperdense vessel or unexpected calcification. Skull: Normal. Negative for fracture or focal lesion. Sinuses/Orbits: No acute finding. Other: None. IMPRESSION: No acute intracranial pathology. Electronically Signed   By: Aram Candelahaddeus  Houston M.D.   On: 10/19/2019 23:53   CT Angio Chest/Abd/Pel for Dissection W and/or Wo Contrast  Result Date: 10/20/2019 CLINICAL DATA:  Lower back pain and syncopal episode. EXAM: CT ANGIOGRAPHY CHEST, ABDOMEN AND PELVIS TECHNIQUE: Non-contrast CT of the chest was initially obtained. Multidetector CT imaging through the chest, abdomen and pelvis was performed using the standard protocol during bolus administration of intravenous contrast. Multiplanar reconstructed images and MIPs were obtained and reviewed to evaluate the vascular anatomy. CONTRAST:  125mL OMNIPAQUE IOHEXOL 350 MG/ML SOLN COMPARISON:  March 30, 2016 FINDINGS: CTA CHEST FINDINGS Cardiovascular: The ascending thoracic aorta measures 4.4 cm x 3.4 cm in maximal measurement. Extensive dissection of the thoracic aorta is again noted. This extends superiorly to involve the origin of the brachiocephalic artery. This is seen on the prior study. Satisfactory opacification of the pulmonary arteries to the segmental level. No evidence of pulmonary embolism. Normal heart size. No pericardial effusion. Mediastinum/Nodes: No enlarged mediastinal, hilar, or axillary lymph nodes. Thyroid gland, trachea, and esophagus demonstrate no significant findings. Lungs/Pleura: Lungs are clear. No  pleural effusion or pneumothorax. Musculoskeletal: No chest wall abnormality. No acute or significant osseous findings. Review of the MIP images confirms the above findings. CTA ABDOMEN AND PELVIS FINDINGS VASCULAR Aorta: Normal caliber aorta without aneurysm, vasculitis or significant stenosis. The previously noted thoracic aortic dissection extends into the abdomen, throughout the length of the abdominal aorta, and into the left common iliac artery and proximal portion of the left external iliac artery. This is seen on the prior study. Celiac: Patent without evidence of aneurysm, dissection, vasculitis or significant stenosis. SMA: Patent without evidence of aneurysm, dissection, vasculitis or significant stenosis. Renals: Both renal arteries are patent without evidence of aneurysm, dissection, vasculitis, fibromuscular dysplasia or significant stenosis. IMA: Patent without evidence of aneurysm, dissection, vasculitis or significant stenosis. Inflow: Patent without evidence of aneurysm, vasculitis or significant stenosis. Veins: No obvious venous abnormality within the limitations of this arterial phase study. Review of the MIP images confirms the above findings. NON-VASCULAR Hepatobiliary: Small, stable focal areas of parenchymal low attenuation is seen within the posterior aspect of the right lobe of the liver. Subcentimeter gallstones are seen within the gallbladder lumen without evidence ofgallbladder wall thickening or biliary dilatation. Pancreas: Unremarkable. No pancreatic ductal dilatation or surrounding inflammatory changes. Spleen: Normal in size without focal abnormality. Adrenals/Urinary Tract: Adrenal glands are unremarkable. Kidneys are normal, without renal calculi, focal lesion, or hydronephrosis. Bladder is unremarkable. Stomach/Bowel: Multiple surgical sutures are seen within the gastric region. Appendix appears normal. No evidence of bowel wall thickening, distention, or inflammatory changes.  Lymphatic: No abnormal abdominal or pelvic number nodes are identified. Reproductive: An IUD is in place. Adjacent 3.8 cm x 3.6 cm, 3.9 cm x 4.3 cm and 5.0 cm x 4.4 cm well-defined, noncalcified soft tissue masses are seen within the uterine fundus. These areas are stable in appearance when compared to the prior exam Other: No abdominal wall hernia or abnormality. No abdominopelvic ascites. Musculoskeletal: No acute osseous findings. A metallic density intramedullary rod is seen within the proximal right femur. Review of the MIP images confirms the above findings. IMPRESSION: 1. 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 This is seen on the prior study. 2. 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 3. Cholelithiasis without evidence of acute cholecystitis. 4. Stable areas of low attenuation within the right lobe of the liver which may represent hepatic hemangiomas. 5. IUD in place. 6. Multiple stable uterine fibroids. Aortic Atherosclerosis (ICD10-I70.0). Electronically Signed   By: Aram Candela M.D.   On: 10/20/2019 00:09    EKG: Independently reviewed. Interpretation : Normal sinus rhythm with nonspecific ST-T wave changes  Assessment/Plan 49 year old female with history of HTN and history of type A thoracic aortic dissection 3 years prior presenting with a syncopal episode  Syncope and collapse -patient  presents with a syncopal episode in which she slumped over falling face forward while seated at a table. Had bowel incontinence -head CT with no acute injury -syncope work-up to include continuous cardiac monitoring, echocardiogram.  Greater suspicion for cardiac etiology versus neuro -neurologic checks -Trend troponins Addendum: Following admission, troponin trended  up from 4 1 admission to 35.  She denies chest pain at this time, complaining only of low back pain.  Will get repeat EKG and cardiology consult to evaluate for possibility of NSTEMI versus demand    History of type a thoracic aortic dissection -CTA aorta showing stable findings - essential hypertension -blood pressure controlled. Continue home meds pending med rec    Morbid (severe) obesity due to excess calories (HCC) -potential complicating factor to prognosis and care    DVT prophylaxis: Lovenox  Code Status: full code  Family Communication:  none  Disposition Plan: Back to previous home environment Consults called: cardiology Status:obs    Andris Baumann MD [facility]     10/20/2019, 1:28 AM

## 2019-10-20 NOTE — ED Notes (Signed)
CRITICAL LAB: TROPONIN is 35, Mikki Santee, [place], NP notified, orders received

## 2019-10-20 NOTE — ED Provider Notes (Signed)
[facility] Emergency Department Provider Note  ____________________________________________  Time seen: Approximately 1:42 AM  I have reviewed the triage vital signs and the nursing notes.   HISTORY  Chief Complaint Loss of Consciousness and Chest Pain   HPI Julie Bush is a 49 y.o. 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  Patient reports that 2 days ago she started having left flank pain that she describes as sharp but not very severe.  She reports that the pain was well controlled at home with Tylenol.  This evening she was at a restaurant having dinner with her family when she started feeling dizzy.  She had a syncopal event and fell face forward on the floor.  She was unconscious for less than a minute.  She was incontinent of stool.  No generalized tonic-clonic activity, no postictal state.  No history of seizures.  Patient denies injuries from the syncope.  She denies headache or neck pain.  Patient denies having any headache or chest pain preceding the syncope.  Since then she is describing cramping pain in her chest and persistent mild pain in her left flank.  Patient denies any alcohol or drug use.  Denies any nausea, vomiting, diarrhea.  Has been eating and drinking normally.   Past Medical History:  Diagnosis Date  . Aortic dissection (HCC)   . Hypertension     Patient Active Problem List   Diagnosis Date Noted  . Syncope and collapse 10/20/2019  . History of aortic dissection 10/20/2019  . Aortic dissection (HCC) 01/23/2017  . Hypertension 01/23/2017  . Hyperlipidemia, unspecified 09/20/2016  . Right carotid bruit 09/20/2016  . Keloid 07/23/2016  . Morbid (severe) obesity due to excess calories (HCC) 04/06/2016    Past Surgical History:  Procedure Laterality Date  . CARDIAC SURGERY    . FOOT SURGERY    .  LAPAROSCOPIC GASTRIC SLEEVE RESECTION      Prior to Admission medications   Medication Sig Start Date End Date Taking? Authorizing Provider  amLODipine (NORVASC) 10 MG tablet Take 10 mg by mouth daily.    [provider]  hydrochlorothiazide (HYDRODIURIL) 25 MG tablet Take 25 mg by mouth daily.    [provider]    Allergies Patient has no known allergies.  History reviewed. No pertinent family history.  Social History Social History   Tobacco Use  . Smoking status: Never Smoker  . Smokeless tobacco: Never Used  Substance Use Topics  . Alcohol use: No  . Drug use: No    Review of Systems  Constitutional: Negative for fever. + syncope Eyes: Negative for visual changes. ENT: Negative for sore throat. Neck: No neck pain  Cardiovascular: Negative for chest pain. Respiratory: Negative for shortness of breath. Gastrointestinal: Negative for abdominal pain, vomiting or diarrhea. Genitourinary: Negative for dysuria. + L flank pain Musculoskeletal: Negative for back pain. Skin: Negative for rash. Neurological: Negative for headaches, weakness or numbness. Psych: No SI or HI  ____________________________________________   PHYSICAL EXAM:  VITAL SIGNS: ED Triage Vitals  Enc Vitals Group     BP 10/19/19 2150 111/69     Pulse Rate 10/19/19 2150 76     Resp 10/19/19 2150 20     Temp 10/19/19 2150 98.5 F (36.9 C)     Temp Source 10/19/19 2150 Oral     SpO2 10/19/19 2150 100 %  Weight 10/19/19 2150 (!) 345 lb (156.5 kg)     Height 10/19/19 2150 5\' 6"  (1.676 m)     Head Circumference --      Peak Flow --      Pain Score 10/20/19 0125 0     Pain Loc --      Pain Edu? --      Excl. in GC? --     Constitutional: Alert and oriented. Well appearing and in no apparent distress. HEENT:      Head: Normocephalic and atraumatic.         Eyes: Conjunctivae are normal. Sclera is non-icteric.       Mouth/Throat: Mucous membranes are moist.       Neck:  Supple with no signs of meningismus.  No C-spine tenderness Cardiovascular: Regular rate and rhythm. No murmurs, gallops, or rubs. 2+ symmetrical distal pulses are present in all extremities. No JVD. Respiratory: Normal respiratory effort. Lungs are clear to auscultation bilaterally. No wheezes, crackles, or rhonchi.  Gastrointestinal: Soft, non tender, and non distended with positive bowel sounds. No rebound or guarding. Genitourinary: No CVA tenderness. Musculoskeletal: Nontender with normal range of motion in all extremities. No edema, cyanosis, or erythema of extremities. Neurologic: Normal speech and language. Face is symmetric. Moving all extremities. No gross focal neurologic deficits are appreciated. Skin: Skin is warm, dry and intact. No rash noted. Psychiatric: Mood and affect are normal. Speech and behavior are normal.  ____________________________________________   LABS (all labs ordered are listed, but only abnormal results are displayed)  Labs Reviewed  BASIC METABOLIC PANEL - Abnormal; Notable for the following components:      Result Value   Glucose, Bld 119 (*)    BUN 32 (*)    Creatinine, Ser 1.18 (*)    GFR calc non Af Amer 54 (*)    All other components within normal limits  CBC - Abnormal; Notable for the following components:   Hemoglobin 11.9 (*)    All other components within normal limits  URINALYSIS, COMPLETE (UACMP) WITH MICROSCOPIC - Abnormal; Notable for the following components:   Color, Urine YELLOW (*)    APPearance CLOUDY (*)    Specific Gravity, Urine 1.038 (*)    Leukocytes,Ua TRACE (*)    Bacteria, UA MANY (*)    All other components within normal limits  SARS CORONAVIRUS 2 BY RT PCR (HOSPITAL ORDER, PERFORMED IN [facility])  URINE CULTURE  HCG, QUANTITATIVE, PREGNANCY  HIV ANTIBODY (ROUTINE TESTING W REFLEX)  POC URINE PREG, ED  POCT PREGNANCY, URINE  TROPONIN I (HIGH SENSITIVITY)  TROPONIN I (HIGH SENSITIVITY)    ____________________________________________  EKG  ED ECG REPORT I, 10/22/19, the attending physician, personally viewed and interpreted this ECG.  Normal sinus rhythm, normal intervals, normal axis, no STE or depressions, no evidence of HOCM, AV block, delta wave, ARVD, prolonged QTc, WPW, or Brugada.  ____________________________________________  RADIOLOGY  I have personally reviewed the images performed during this visit and I agree with the Radiologist's read.   Interpretation by Radiologist:  DG Chest 2 View  Result Date: 10/19/2019 CLINICAL DATA:  Chest pain EXAM: CHEST - 2 VIEW COMPARISON:  03/30/2016 FINDINGS: Lungs volumes are small, but are symmetric and are clear. No pneumothorax or pleural effusion. Cardiac size within normal limits. Pulmonary vascularity is normal. Osseous structures are age-appropriate. No acute bone abnormality. Surgical clips are noted within the right axilla. IMPRESSION: No active cardiopulmonary disease. Electronically Signed   By: 05/28/2016  Ramiro Harvest MD   On: 10/19/2019 22:22   CT Head Wo Contrast  Result Date: 10/19/2019 CLINICAL DATA:  Lower back pain and syncopal episode. EXAM: CT HEAD WITHOUT CONTRAST TECHNIQUE: Contiguous axial images were obtained from the base of the skull through the vertex without intravenous contrast. COMPARISON:  October 07, 2004 FINDINGS: Brain: No evidence of acute infarction, hemorrhage, hydrocephalus, extra-axial collection or mass lesion/mass effect. Vascular: No hyperdense vessel or unexpected calcification. Skull: Normal. Negative for fracture or focal lesion. Sinuses/Orbits: No acute finding. Other: None. IMPRESSION: No acute intracranial pathology. Electronically Signed   By: Aram Candela M.D.   On: 10/19/2019 23:53   CT Angio Chest/Abd/Pel for Dissection W and/or Wo Contrast  Result Date: 10/20/2019 CLINICAL DATA:  Lower back pain and syncopal episode. EXAM: CT ANGIOGRAPHY CHEST, ABDOMEN AND PELVIS  TECHNIQUE: Non-contrast CT of the chest was initially obtained. Multidetector CT imaging through the chest, abdomen and pelvis was performed using the standard protocol during bolus administration of intravenous contrast. Multiplanar reconstructed images and MIPs were obtained and reviewed to evaluate the vascular anatomy. CONTRAST:  OMNIPAQUE IOHEXOL 350 MG/ML SOLN COMPARISON:  March 30, 2016 FINDINGS: CTA CHEST FINDINGS Cardiovascular: The ascending thoracic aorta measures 4.4 cm x 3.4 cm in maximal measurement. Extensive dissection of the thoracic aorta is again noted. This extends superiorly to involve the origin of the brachiocephalic artery. This is seen on the prior study. Satisfactory opacification of the pulmonary arteries to the segmental level. No evidence of pulmonary embolism. Normal heart size. No pericardial effusion. Mediastinum/Nodes: No enlarged mediastinal, hilar, or axillary lymph nodes. Thyroid gland, trachea, and esophagus demonstrate no significant findings. Lungs/Pleura: Lungs are clear. No pleural effusion or pneumothorax. Musculoskeletal: No chest wall abnormality. No acute or significant osseous findings. Review of the MIP images confirms the above findings. CTA ABDOMEN AND PELVIS FINDINGS VASCULAR Aorta: Normal caliber aorta without aneurysm, vasculitis or significant stenosis. The previously noted thoracic aortic dissection extends into the abdomen, throughout the length of the abdominal aorta, and into the left common iliac artery and proximal portion of the left external iliac artery. This is seen on the prior study. Celiac: Patent without evidence of aneurysm, dissection, vasculitis or significant stenosis. SMA: Patent without evidence of aneurysm, dissection, vasculitis or significant stenosis. Renals: Both renal arteries are patent without evidence of aneurysm, dissection, vasculitis, fibromuscular dysplasia or significant stenosis. IMA: Patent without evidence of aneurysm,  dissection, vasculitis or significant stenosis. Inflow: Patent without evidence of aneurysm, vasculitis or significant stenosis. Veins: No obvious venous abnormality within the limitations of this arterial phase study. Review of the MIP images confirms the above findings. NON-VASCULAR Hepatobiliary: Small, stable focal areas of parenchymal low attenuation is seen within the posterior aspect of the right lobe of the liver. Subcentimeter gallstones are seen within the gallbladder lumen without evidence ofgallbladder wall thickening or biliary dilatation. Pancreas: Unremarkable. No pancreatic ductal dilatation or surrounding inflammatory changes. Spleen: Normal in size without focal abnormality. Adrenals/Urinary Tract: Adrenal glands are unremarkable. Kidneys are normal, without renal calculi, focal lesion, or hydronephrosis. Bladder is unremarkable. Stomach/Bowel: Multiple surgical sutures are seen within the gastric region. Appendix appears normal. No evidence of bowel wall thickening, distention, or inflammatory changes. Lymphatic: No abnormal abdominal or pelvic number nodes are identified. Reproductive: An IUD is in place. Adjacent 3.8 cm x 3.6 cm, 3.9 cm x 4.3 cm and 5.0 cm x 4.4 cm well-defined, noncalcified soft tissue masses are seen within the uterine fundus. These areas are stable in appearance when compared  to the prior exam Other: No abdominal wall hernia or abnormality. No abdominopelvic ascites. Musculoskeletal: No acute osseous findings. A metallic density intramedullary rod is seen within the proximal right femur. Review of the MIP images confirms the above findings. IMPRESSION: 1. 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 This is seen on the prior study. 2. 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 3. Cholelithiasis without evidence of acute cholecystitis. 4. Stable areas of low attenuation within the right lobe of the liver which may represent hepatic hemangiomas. 5. IUD in place. 6. Multiple stable uterine fibroids. Aortic Atherosclerosis (ICD10-I70.0). Electronically Signed   By: Aram Candela M.D.   On: 10/20/2019 00:09     ____________________________________________   PROCEDURES  Procedure(s) performed:yes .1-3 Lead EKG Interpretation Performed by: Nita Sickle, MD Authorized by: Nita Sickle, MD     Interpretation: normal     ECG rate assessment: normal     Rhythm: sinus rhythm     Ectopy: none     Critical Care performed: yes  CRITICAL CARE Performed by: Nita Sickle  ?  Total critical care time: 35 min  Critical care time was exclusive of separately billable procedures and treating other patients.  Critical care was necessary to treat or prevent imminent or life-threatening deterioration.  Critical care was time spent personally by me on the following activities: development of treatment plan with patient and/or surrogate as well as nursing, discussions with consultants, evaluation of patient's response to treatment, examination of patient, obtaining history from patient or surrogate, ordering and performing treatments and interventions, ordering and review of laboratory studies, ordering and review of radiographic studies, pulse oximetry and re-evaluation of patient's condition.  ____________________________________________   INITIAL IMPRESSION / ASSESSMENT AND PLAN / ED COURSE  49 y.o. 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  Patient did not sustain any injuries for her syncopal event.  Neurologically intact.  Exam is  otherwise benign.  EKG showing no signs of dysrhythmias or ischemia.  CT angio of the chest, abdomen, and pelvis negative for dissection or any other acute findings, confirmed by radiology.  Her troponin is negative.  Labs with no significant electrolyte derangements.  No leukocytosis.  Mild stable anemia.  hCG negative.  UA positive for UTI for which she was given Rocephin.  No signs of sepsis.  Patient placed on telemetry for close monitoring.  Will admit for evaluation of syncope. Ddx cardiac dysrhythmia versus vasovagal versus orthostasis versus dissection versus PE.  We will send a urine for evaluation since patient is also complaining of left flank pain.  History gathered from patient and family member who is at bedside.  Old medical records reviewed.  Plan discussed with both of them.        _____________________________________________ Please note:  Patient was evaluated in Emergency Department today for the symptoms described in the history of present illness. Patient was evaluated in the context of the global COVID-19 pandemic, which necessitated consideration that the patient might be at risk for infection with the SARS-CoV-2 virus that causes COVID-19. Institutional protocols and algorithms that pertain to the evaluation of patients at risk  for COVID-19 are in a state of rapid change based on information released by regulatory bodies including the CDC and federal and state organizations. These policies and algorithms were followed during the patient's care in the ED.  Some ED evaluations and interventions may be delayed as a result of limited staffing during the pandemic.   [place] Controlled Substance Database was reviewed by me. ____________________________________________   FINAL CLINICAL IMPRESSION(S) / ED DIAGNOSES   Final diagnoses:  Syncope, unspecified syncope type  Acute cystitis with hematuria      NEW MEDICATIONS STARTED DURING THIS VISIT:  ED Discharge Orders     None       Note:  This document was prepared using Dragon voice recognition software and may include unintentional dictation errors.    Don PerkingVeronese, [place], MD 10/20/19 [phone]

## 2019-10-20 NOTE — Progress Notes (Signed)
PHARMACIST - PHYSICIAN COMMUNICATION  CONCERNING:  Enoxaparin (Lovenox) for DVT Prophylaxis    RECOMMENDATION: Patient was prescribed enoxaprin 40mg  q24 hours for VTE prophylaxis.   Filed Weights   10/19/19 2150  Weight: (!) 156.5 kg (345 lb)    Body mass index is 55.68 kg/m.  Estimated Creatinine Clearance: 89.4 mL/min (A) (by C-G formula based on SCr of 1.18 mg/dL (H)).   Based on [facility] policy patient is candidate for enoxaparin 40mg  every 12 hour dosing due to BMI being >40.  DESCRIPTION: Pharmacy has adjusted enoxaparin dose per [facility] policy.  Patient is now receiving enoxaparin 40mg  every 12 hours.   [facility], PharmD Clinical Pharmacist  10/20/2019 1:35 AM

## 2019-10-21 DIAGNOSIS — Z8679 Personal history of other diseases of the circulatory system: Secondary | ICD-10-CM | POA: Diagnosis not present

## 2019-10-21 DIAGNOSIS — R55 Syncope and collapse: Secondary | ICD-10-CM | POA: Diagnosis not present

## 2019-10-21 LAB — BASIC METABOLIC PANEL
Anion gap: 11 (ref 5–15)
BUN: 25 mg/dL — ABNORMAL HIGH (ref 6–20)
CO2: 22 mmol/L (ref 22–32)
Calcium: 8.7 mg/dL — ABNORMAL LOW (ref 8.9–10.3)
Chloride: 104 mmol/L (ref 98–111)
Creatinine, Ser: 0.89 mg/dL (ref 0.44–1.00)
GFR calc Af Amer: >60 mL/min (ref >60)
GFR calc non Af Amer: >60 mL/min (ref >60)
Glucose, Bld: 101 mg/dL — ABNORMAL HIGH (ref 70–99)
Potassium: 4.7 mmol/L (ref 3.5–5.1)
Sodium: 137 mmol/L (ref 135–145)

## 2019-10-21 LAB — URINE CULTURE: Culture: 50000 — AB

## 2019-10-21 LAB — GLUCOSE, CAPILLARY: Glucose-Capillary: 96 mg/dL (ref 70–99)

## 2019-10-21 LAB — HIV ANTIBODY (ROUTINE TESTING W REFLEX): HIV Screen 4th Generation wRfx: NONREACTIVE

## 2019-10-21 NOTE — Progress Notes (Signed)
Subjective:  Denies SSCP, palpitations or Dyspnea Wants to go home   Objective:  Vitals:   10/20/19 1917 10/21/19 0015 10/21/19 0535 10/21/19 0756  BP: (!) 97/58 (!) 96/53 (!) 109/59 117/68  Pulse: 86 83 77 75  Resp: 17 18 16 18   Temp: 97.9 F (36.6 C) 98.5 F (36.9 C) 98.5 F (36.9 C) (!) 97.4 F (36.3 C)  TempSrc: Oral Oral Oral Oral  SpO2: 98% 97% 96% 99%  Weight:      Height:        Intake/Output from previous day:  Intake/Output Summary (Last 24 hours) at 10/21/2019 0913 Last data filed at 10/20/2019 1522 Gross per 24 hour  Intake 3 ml  Output 300 ml  Net -297 ml    Physical Exam: Affect appropriate Obese black female  HEENT: normal Neck supple with no adenopathy JVP normal no bruits no thyromegaly Lungs clear with no wheezing and good diaphragmatic motion Heart:  S1/S2 no murmur, no rub, gallop or click  Post sternotomy for dissection  Abdomen: benighn, BS positve, no tenderness, no AAA no bruit.  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 EXAM: CT ANGIOGRAPHY HEAD AND NECK TECHNIQUE: Multidetector CT imaging of the head and neck was performed using the standard protocol during bolus administration of intravenous contrast. Multiplanar CT image reconstructions and MIPs were obtained to evaluate the vascular anatomy. Carotid stenosis measurements (when applicable) are obtained utilizing NASCET criteria, using the distal internal carotid  diameter as the denominator. 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 Poor assessment of the distal brachiocephalic and proximal right subclavian and right common carotid arteries due to streak artifact from venous contrast. No involvement of the left common carotid or left subclavian artery origins. Right carotid system: Patent without evidence of stenosis or dissection. Left carotid system: Patent without evidence of stenosis or dissection. Minimal calcified plaque at the carotid bifurcation. Tortuous distal cervical ICA. Vertebral arteries: Patent without evidence of stenosis or dissection. Mildly dominant left vertebral artery. Skeleton: No acute osseous abnormality or suspicious osseous lesion. Other neck: No evidence of cervical lymphadenopathy or mass. Upper chest: Clear lung apices. Review of the MIP images confirms the above findings CTA HEAD FINDINGS Anterior circulation: The internal carotid arteries are patent from skull base to carotid termini with mild atherosclerotic plaque bilaterally not resulting in significant stenosis. ACAs and MCAs are patent without evidence of a proximal branch occlusion or significant proximal stenosis. No aneurysm is identified. Posterior circulation: Intracranial vertebral arteries are widely patent to the basilar. Patent left PICA, bilateral AICA, and bilateral SCA origins are visualized. The basilar artery is widely patent. There is a small right posterior communicating artery. Both PCAs  are patent without evidence of a significant proximal stenosis. No aneurysm is identified. Venous sinuses: Patent. Anatomic variants: None. Review of the MIP images confirms the above findings IMPRESSION: 1. 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 2. Widely patent  cervical carotid and vertebral arteries without dissection or stenosis. 3. Mild intracranial atherosclerosis without large vessel occlusion or significant proximal stenosis. Electronically Signed   By: Sebastian AcheAllen  Grady M.D.   On: 10/20/2019 13:13   DG Chest 2 View  Result Date: 10/19/2019 CLINICAL DATA:  Chest pain EXAM: CHEST - 2 VIEW COMPARISON:  03/30/2016 FINDINGS: Lungs volumes are small, but are symmetric and are clear. No pneumothorax or pleural effusion. Cardiac size within normal limits. Pulmonary vascularity is normal. Osseous structures are age-appropriate. No acute bone abnormality. Surgical clips are noted within the right axilla. IMPRESSION: No active cardiopulmonary disease. Electronically Signed   By: Helyn NumbersAshesh  Parikh MD   On: 10/19/2019 22:22   CT Head Wo Contrast  Result Date: 10/19/2019 CLINICAL DATA:  Lower back pain and syncopal episode. EXAM: CT HEAD WITHOUT CONTRAST TECHNIQUE: Contiguous axial images were obtained from the base of the skull through the vertex without intravenous contrast. COMPARISON:  October 07, 2004 FINDINGS: Brain: No evidence of acute infarction, hemorrhage, hydrocephalus, extra-axial collection or mass lesion/mass effect. Vascular: No hyperdense vessel or unexpected calcification. Skull: Normal. Negative for fracture or focal lesion. Sinuses/Orbits: No acute finding. Other: None. IMPRESSION: No acute intracranial pathology. Electronically Signed   By: Aram Candelahaddeus  Houston M.D.   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 EXAM: CT ANGIOGRAPHY HEAD AND NECK TECHNIQUE: Multidetector CT imaging of the head and neck was performed using the standard protocol during bolus administration of intravenous contrast. Multiplanar CT image reconstructions and MIPs were obtained to evaluate the vascular anatomy. Carotid stenosis measurements (when applicable) are obtained  utilizing NASCET criteria, using the distal internal carotid diameter as the denominator. 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 Poor assessment of the distal brachiocephalic and proximal right subclavian and right common carotid arteries due to streak artifact from venous contrast. No involvement of the left common carotid or left subclavian artery origins. Right carotid system: Patent without evidence of stenosis or dissection. Left carotid system: Patent without evidence of stenosis or dissection. Minimal calcified plaque at the carotid bifurcation. Tortuous distal cervical ICA. Vertebral arteries: Patent without evidence of stenosis or dissection. Mildly dominant left vertebral artery. Skeleton: No acute osseous abnormality or suspicious osseous lesion. Other neck: No evidence of cervical lymphadenopathy or mass. Upper chest: Clear lung apices. Review of the MIP images confirms the above findings CTA HEAD FINDINGS Anterior circulation: The internal carotid arteries are patent from skull base to carotid termini with mild atherosclerotic plaque bilaterally not resulting in significant stenosis. ACAs and MCAs are patent without evidence of a proximal branch occlusion or significant proximal stenosis. No aneurysm is identified. Posterior circulation: Intracranial vertebral arteries are widely patent to the basilar. Patent left PICA, bilateral AICA, and bilateral SCA origins are visualized. The basilar artery is widely patent. There is a small right posterior communicating artery. Both PCAs are patent without evidence of a significant proximal stenosis. No aneurysm is identified. Venous sinuses: Patent. Anatomic variants: None. Review of the MIP  images confirms the above findings IMPRESSION: 1. Known aortic dissection with  involvement of the brachiocephalic artery. 2. Widely patent cervical carotid and vertebral arteries without dissection or stenosis. 3. Mild intracranial atherosclerosis without large vessel occlusion or significant proximal stenosis. Electronically Signed   By: Sebastian Ache M.D.   On: 10/20/2019 13:13   CT Angio Chest/Abd/Pel for Dissection W and/or Wo Contrast  Result Date: 10/20/2019 CLINICAL DATA:  Lower back pain and syncopal episode. EXAM: CT ANGIOGRAPHY CHEST, ABDOMEN AND PELVIS TECHNIQUE: Non-contrast CT of the chest was initially obtained. Multidetector CT imaging through the chest, abdomen and pelvis was performed using the standard protocol during bolus administration of intravenous contrast. Multiplanar reconstructed images and MIPs were obtained and reviewed to evaluate the vascular anatomy. CONTRAST:  OMNIPAQUE IOHEXOL 350 MG/ML SOLN COMPARISON:  March 30, 2016 FINDINGS: CTA CHEST FINDINGS Cardiovascular: The ascending thoracic aorta measures 4.4 cm x 3.4 cm in maximal measurement. Extensive dissection of the thoracic aorta is again noted. This extends superiorly to involve the origin of the brachiocephalic artery. This is seen on the prior study. Satisfactory opacification of the pulmonary arteries to the segmental level. No evidence of pulmonary embolism. Normal heart size. No pericardial effusion. Mediastinum/Nodes: No enlarged mediastinal, hilar, or axillary lymph nodes. Thyroid gland, trachea, and esophagus demonstrate no significant findings. Lungs/Pleura: Lungs are clear. No pleural effusion or pneumothorax. Musculoskeletal: No chest wall abnormality. No acute or significant osseous findings. Review of the MIP images confirms the above findings. CTA ABDOMEN AND PELVIS FINDINGS VASCULAR Aorta: Normal caliber aorta without aneurysm, vasculitis or significant stenosis. The previously noted thoracic aortic dissection extends into the abdomen, throughout the length of the abdominal aorta,  and into the left common iliac artery and proximal portion of the left external iliac artery. This is seen on the prior study. Celiac: Patent without evidence of aneurysm, dissection, vasculitis or significant stenosis. SMA: Patent without evidence of aneurysm, dissection, vasculitis or significant stenosis. Renals: Both renal arteries are patent without evidence of aneurysm, dissection, vasculitis, fibromuscular dysplasia or significant stenosis. IMA: Patent without evidence of aneurysm, dissection, vasculitis or significant stenosis. Inflow: Patent without evidence of aneurysm, vasculitis or significant stenosis. Veins: No obvious venous abnormality within the limitations of this arterial phase study. Review of the MIP images confirms the above findings. NON-VASCULAR Hepatobiliary: Small, stable focal areas of parenchymal low attenuation is seen within the posterior aspect of the right lobe of the liver. Subcentimeter gallstones are seen within the gallbladder lumen without evidence ofgallbladder wall thickening or biliary dilatation. Pancreas: Unremarkable. No pancreatic ductal dilatation or surrounding inflammatory changes. Spleen: Normal in size without focal abnormality. Adrenals/Urinary Tract: Adrenal glands are unremarkable. Kidneys are normal, without renal calculi, focal lesion, or hydronephrosis. Bladder is unremarkable. Stomach/Bowel: Multiple surgical sutures are seen within the gastric region. Appendix appears normal. No evidence of bowel wall thickening, distention, or inflammatory changes. Lymphatic: No abnormal abdominal or pelvic number nodes are identified. Reproductive: An IUD is in place. Adjacent 3.8 cm x 3.6 cm, 3.9 cm x 4.3 cm and 5.0 cm x 4.4 cm well-defined, noncalcified soft tissue masses are seen within the uterine fundus. These areas are stable in appearance when compared to the prior exam Other: No abdominal wall hernia or abnormality. No abdominopelvic ascites. Musculoskeletal: No  acute osseous findings. A metallic density intramedullary rod is seen within the proximal right femur. Review of the MIP images confirms the above findings. IMPRESSION: 1. 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 This is seen on the prior study. 2. 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 3. Cholelithiasis without evidence of acute cholecystitis. 4. Stable areas of low attenuation within the right lobe of the liver which may represent hepatic hemangiomas. 5. IUD in place. 6. Multiple stable uterine fibroids. Aortic Atherosclerosis (ICD10-I70.0). Electronically Signed   By: Aram Candela M.D.   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 amLODipine  10 mg Oral Daily  . atorvastatin  40 mg Oral QPM  . carvedilol  25 mg Oral BID  . cholecalciferol  1,000 Units Oral Daily  . enoxaparin (LOVENOX) injection  40 mg Subcutaneous Q12H  . irbesartan  75 mg Oral Daily  . metFORMIN  1,000 mg Oral QPM  . sertraline  100 mg Oral Daily  . sodium chloride flush  3 mL Intravenous Q12H  . spironolactone  25 mg Oral BID     . sodium chloride 75 mL/hr at 10/21/19 0606    Assessment/Plan:   1. Syncope:  Vasovagal no evidence of acute coronary syndrome no acute ST changes CT with no calcium in cors rhythm stable and echo normal ok to d/c home she prefers to f/u at [facility]  2. Dissection: history of continue beta blocker CTA with intact repair and no AR ? AV resuspended  3. HTN:  Well controlled.  Continue current medications and low sodium Dash type  diet.    4. DM:  Discussed low carb diet.  Target hemoglobin A1c is 6.5 or less.  Continue current medications.  5 HLD  Continue statin   Charlton Haws 10/21/2019, 9:13 AM

## 2019-10-21 NOTE — Discharge Summary (Addendum)
Physician Discharge Summary  Julie Bush VEL:[id number] DOB: [birth year] DOA: 10/20/2019  PCP: Patient, No Pcp Per  Admit date: 10/20/2019 Discharge date: 10/21/2019  Discharge disposition: Home   Recommendations for Outpatient Follow-Up:   Follow-up with PCP in 1 week   Discharge Diagnosis:   Principal Problem:   Syncope and collapse Active Problems:   Morbid (severe) obesity due to excess calories (HCC)   History of aortic dissection    Discharge Condition: Stable.  Diet recommendation:  Diet Order            Diet - low sodium heart healthy           Diet Carb Modified                     Code Status: Full Code     Hospital Course:   Julie Bush is a 49 y.o. 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 She apparently slid onto the floor while seated at the table level restaurant.   She was admitted to progressive cardiac telemetry for observation.  Syncope suspected to be vasovagal in origin.  There was no evidence of orthostatic hypotension.  CTA head and neck were unremarkable.  No evidence of pulmonary embolism.  She was given IV fluids because of concern for dehydration.  She was evaluated by the cardiologist.  Dr. Eden Emms reviewed 2D echo.  From his standpoint, patient is okay for discharge.  She is doing better and she is deemed stable for discharge to home today.  Discharge plan was discussed with the patient and she agreed with the plan.      Discharge Exam:   Vitals:   10/21/19 0535 10/21/19 0756  BP: (!) 109/59 117/68  Pulse: 77 75  Resp: 16 18  Temp: 98.5 F (36.9 C) (!) 97.4 F (36.3 C)  SpO2: 96% 99%   Vitals:   10/20/19 1917 10/21/19 0015 10/21/19 0535 10/21/19 0756  BP: (!) 97/58 (!) 96/53 (!) 109/59 117/68  Pulse: 86 83 77 75  Resp: 17 18 16  18   Temp: 97.9 F (36.6 C) 98.5 F (36.9 C) 98.5 F (36.9 C) (!) 97.4 F (36.3 C)  TempSrc: Oral Oral Oral Oral  SpO2: 98% 97% 96% 99%  Weight:      Height:         GEN: NAD SKIN: No rash EYES: EOMI ENT: MMM CV: RRR PULM: CTA B ABD: soft, obese, NT, +BS CNS: AAO x 3, non focal EXT: No edema or tenderness   The results of significant diagnostics from this hospitalization (including imaging, microbiology, ancillary and laboratory) are listed below for reference.     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 EXAM: CT ANGIOGRAPHY HEAD AND NECK TECHNIQUE: Multidetector CT imaging of the head and neck was performed using the standard protocol during bolus administration of intravenous contrast. Multiplanar CT image reconstructions and MIPs were obtained to evaluate the vascular anatomy. Carotid stenosis measurements (when applicable) are obtained utilizing NASCET criteria, using the distal internal carotid diameter as the denominator. CONTRAST:  61mL OMNIPAQUE IOHEXOL 350 MG/ML SOLN COMPARISON:  CTA chest, abdomen, and pelvis 10/19/2019 FINDINGS: CTA NECK FINDINGS Aortic arch: Known aortic  dissection with involvement of the brachiocephalic artery as previously seen. Poor assessment of the distal brachiocephalic and proximal right subclavian and right common carotid arteries due to streak artifact from venous contrast. No involvement of the left common carotid or left subclavian artery origins. Right carotid system: Patent without evidence of stenosis or dissection. Left carotid system: Patent without evidence of stenosis or dissection. Minimal calcified plaque at the carotid bifurcation. Tortuous distal cervical ICA. Vertebral arteries: Patent without evidence of stenosis or dissection. Mildly dominant left vertebral artery. Skeleton: No acute osseous abnormality  or suspicious osseous lesion. Other neck: No evidence of cervical lymphadenopathy or mass. Upper chest: Clear lung apices. Review of the MIP images confirms the above findings CTA HEAD FINDINGS Anterior circulation: The internal carotid arteries are patent from skull base to carotid termini with mild atherosclerotic plaque bilaterally not resulting in significant stenosis. ACAs and MCAs are patent without evidence of a proximal branch occlusion or significant proximal stenosis. No aneurysm is identified. Posterior circulation: Intracranial vertebral arteries are widely patent to the basilar. Patent left PICA, bilateral AICA, and bilateral SCA origins are visualized. The basilar artery is widely patent. There is a small right posterior communicating artery. Both PCAs are patent without evidence of a significant proximal stenosis. No aneurysm is identified. Venous sinuses: Patent. Anatomic variants: None. Review of the MIP images confirms the above findings IMPRESSION: 1. 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 2. Widely patent cervical carotid and vertebral arteries without dissection or stenosis. 3. Mild intracranial atherosclerosis without large vessel occlusion or significant proximal stenosis. Electronically Signed   By: Sebastian Ache M.D.   On: 10/20/2019 13:13   CT ANGIO NECK W OR WO CONTRAST  Result Date: 10/20/2019 CLINICAL DATA:  Type A aortic dissection involving the brachiocephalic artery. EXAM: CT ANGIOGRAPHY HEAD AND NECK TECHNIQUE: Multidetector CT imaging of the head and neck was performed using the standard protocol during bolus administration of intravenous contrast. Multiplanar CT image reconstructions and MIPs were obtained to evaluate the vascular anatomy. Carotid stenosis measurements (when applicable) are obtained utilizing NASCET criteria, using the distal internal carotid diameter as the denominator. CONTRAST:  99mL OMNIPAQUE IOHEXOL 350 MG/ML SOLN  COMPARISON:  CTA chest, abdomen, and pelvis 10/19/2019 FINDINGS: CTA NECK FINDINGS Aortic arch: Known aortic dissection with involvement of the brachiocephalic artery as previously seen. Poor assessment of the distal brachiocephalic and proximal right subclavian and right common carotid arteries due to streak artifact from venous contrast. No involvement of the left common carotid or left subclavian artery origins. Right carotid system: Patent without evidence of stenosis or dissection. Left carotid system: Patent without evidence of stenosis or dissection. Minimal calcified plaque at the carotid bifurcation. Tortuous distal cervical ICA. Vertebral arteries: Patent without evidence of stenosis or dissection. Mildly dominant left vertebral artery. Skeleton: No acute osseous abnormality or suspicious osseous lesion. Other neck: No evidence of cervical lymphadenopathy or mass. Upper chest: Clear lung apices. Review of the MIP images confirms the above findings CTA HEAD FINDINGS Anterior circulation: The internal carotid arteries are patent from skull base to carotid termini with mild atherosclerotic plaque bilaterally not resulting in significant stenosis. ACAs and MCAs are patent without evidence of a proximal branch occlusion or significant proximal stenosis. No aneurysm is identified. Posterior circulation: Intracranial vertebral arteries are widely patent to the basilar. Patent left PICA, bilateral AICA, and bilateral SCA origins are visualized. The basilar artery is widely patent. There is a small right posterior communicating artery. Both PCAs are patent  without evidence of a significant proximal stenosis. No aneurysm is identified. Venous sinuses: Patent. Anatomic variants: None. Review of the MIP images confirms the above findings IMPRESSION: 1. 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 2. Widely patent cervical carotid and vertebral arteries without dissection or stenosis. 3. Mild  intracranial atherosclerosis without large vessel occlusion or significant proximal stenosis. Electronically Signed   By: Sebastian AcheAllen  Grady M.D.   On: 10/20/2019 13:13     Labs:   Basic Metabolic Panel: Recent Labs  Lab 10/19/19 2154 10/21/19 0636  NA 137 137  K 4.2 4.7  CL 101 104  CO2 26 22  GLUCOSE 119* 101*  BUN 32* 25*  CREATININE 1.18* 0.89  CALCIUM 9.0 8.7*   GFR Estimated Creatinine Clearance: 118.5 mL/min (by C-G formula based on SCr of 0.89 mg/dL). Liver Function Tests: No results for input(s): AST, ALT, ALKPHOS, BILITOT, PROT, ALBUMIN in the last 168 hours. No results for input(s): LIPASE, AMYLASE in the last 168 hours. No results for input(s): AMMONIA in the last 168 hours. Coagulation profile No results for input(s): INR, PROTIME in the last 168 hours.  CBC: Recent Labs  Lab 10/19/19 2154  WBC 9.3  HGB 11.9*  HCT 36.6  MCV 91.3  PLT 197   Cardiac Enzymes: No results for input(s): CKTOTAL, CKMB, CKMBINDEX, TROPONINI in the last 168 hours. BNP: Invalid input(s): POCBNP CBG: Recent Labs  Lab 10/20/19 0846 10/21/19 0559  GLUCAP 130* 96   D-Dimer No results for input(s): DDIMER in the last 72 hours. Hgb A1c No results for input(s): HGBA1C in the last 72 hours. Lipid Profile No results for input(s): CHOL, HDL, LDLCALC, TRIG, CHOLHDL, LDLDIRECT in the last 72 hours. Thyroid function studies No results for input(s): TSH, T4TOTAL, T3FREE, THYROIDAB in the last 72 hours.  Invalid input(s): FREET3 Anemia work up No results for input(s): VITAMINB12, FOLATE, FERRITIN, TIBC, IRON, RETICCTPCT in the last 72 hours. Microbiology Recent Results (from the past 240 hour(s))  SARS Coronavirus 2 by RT PCR (hospital order, performed in [facility]) Nasopharyngeal Nasopharyngeal Swab     Status: None   Collection Time: 10/20/19  1:25 AM   Specimen: Nasopharyngeal Swab  Result Value Ref Range Status   SARS Coronavirus 2 NEGATIVE NEGATIVE Final     Comment: (NOTE) SARS-CoV-2 target nucleic acids are NOT DETECTED.  The SARS-CoV-2 RNA is generally detectable in upper and lower respiratory specimens during the acute phase of infection. The lowest concentration of SARS-CoV-2 viral copies this assay can detect is 250 copies / mL. A negative result does not preclude SARS-CoV-2 infection and should not be used as the sole basis for treatment or other patient management decisions.  A negative result may occur with improper specimen collection / handling, submission of specimen other than nasopharyngeal swab, presence of viral mutation(s) within the areas targeted by this assay, and inadequate number of viral copies (<250 copies / mL). A negative result must be combined with clinical observations, patient history, and epidemiological information.  Fact Sheet for Patients:   BoilerBrush.com.cyhttps://www.fda.gov/media/136312/download  Fact Sheet for Healthcare Providers: https://pope.com/https://www.fda.gov/media/136313/download  This test is not yet approved or  cleared by the [place] FDA and has been authorized for detection and/or diagnosis of SARS-CoV-2 by FDA under an Emergency Use Authorization (EUA).  This EUA will remain in effect (meaning this test can be used) for the duration of the COVID-19 declaration under Section 564(b)(1) of the Act, 21 U.S.C. section 360bbb-3(b)(1), unless the authorization is terminated  or revoked sooner.  Performed at [facility], [address] [place], [place] [address]   Urine culture     Status: Abnormal   Collection Time: 10/20/19  1:42 AM   Specimen: Urine, Random  Result Value Ref Range Status   Specimen Description   Final    URINE, RANDOM Performed at [facility], [address], [place], [place] [address]    Special Requests   Final    NONE Performed at [facility], [address] [place], [place] [address]    Culture (A)  Final    50,000 COLONIES/mL MULTIPLE SPECIES  PRESENT, SUGGEST RECOLLECTION   Report Status 10/21/2019 FINAL  Final     Discharge Instructions:   Discharge Instructions    Diet - low sodium heart healthy   Complete by: As directed    Diet Carb Modified   Complete by: As directed    Increase activity slowly   Complete by: As directed      Allergies as of 10/21/2019      Reactions   Hydrochlorothiazide Rash      Medication List    TAKE these medications   amLODipine 10 MG tablet Commonly known as: NORVASC Take 10 mg by mouth daily.   atorvastatin 40 MG tablet Commonly known as: LIPITOR Take 40 mg by mouth every evening.   BARIATRIC MULTIVITAMINS/IRON PO Take 1 tablet by mouth daily.   carvedilol 25 MG tablet Commonly known as: COREG Take 25 mg by mouth 2 (two) times daily.   cholecalciferol 25 MCG (1000 UNIT) tablet Commonly known as: VITAMIN D3 Take 1,000 Units by mouth daily.   metFORMIN 500 MG 24 hr tablet Commonly known as: GLUCOPHAGE-XR Take 1,000 mg by mouth every evening.   sertraline 100 MG tablet Commonly known as: ZOLOFT Take 100 mg by mouth daily.   spironolactone 25 MG tablet Commonly known as: ALDACTONE Take 25 mg by mouth 2 (two) times daily.   valsartan 80 MG tablet Commonly known as: DIOVAN Take 80 mg by mouth daily.         Time coordinating discharge: 27 minutes  Signed:  Candi Profit  [facility] 10/21/2019, 10:55 AM

## 2019-10-21 NOTE — Progress Notes (Signed)
Preliminary echo report shows normal lv function with no evidence of valvular heart disease.

## 2019-10-23 LAB — ECHOCARDIOGRAM COMPLETE
Area-P 1/2: 3.91 cm2
Height: 66 in
S' Lateral: 2.17 cm
Weight: 5521.6 oz

## 2022-04-04 IMAGING — CT CT ANGIO HEAD
2 of 3 series · 10 of 20 positions shown · IV contrast (APPLIED)
Comparison: CTA chest, abdomen, and pelvis 10/19/2019

CLINICAL DATA: Type A aortic dissection involving the
brachiocephalic artery.

EXAM:
CT ANGIOGRAPHY HEAD AND NECK
TECHNIQUE: Multidetector CT imaging of the head and neck was performed using
the standard protocol during bolus administration of intravenous
contrast. Multiplanar CT image reconstructions and MIPs were
obtained to evaluate the vascular anatomy. Carotid stenosis
measurements (when applicable) are obtained utilizing NASCET
criteria, using the distal internal carotid diameter as the
denominator.
CONTRAST:  75mL OMNIPAQUE IOHEXOL 350 MG/ML SOLN

[Series 512: cta head neck · axial · 0.49mm/px · z∈[-380,-136]mm · 8 of 158 slices shown]
[im 18/158  soft-tissue]
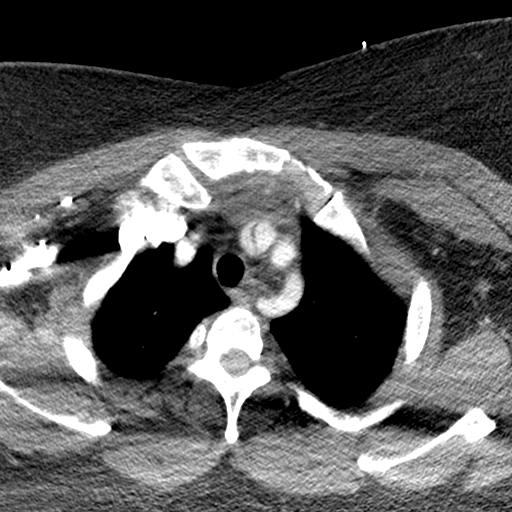
[im 35/158  bone]
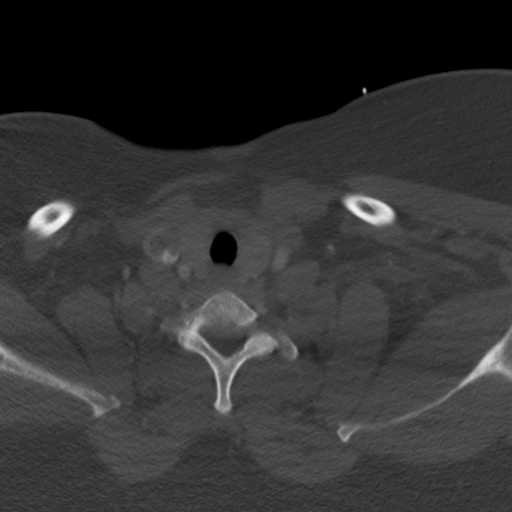
[im 53/158  soft-tissue]
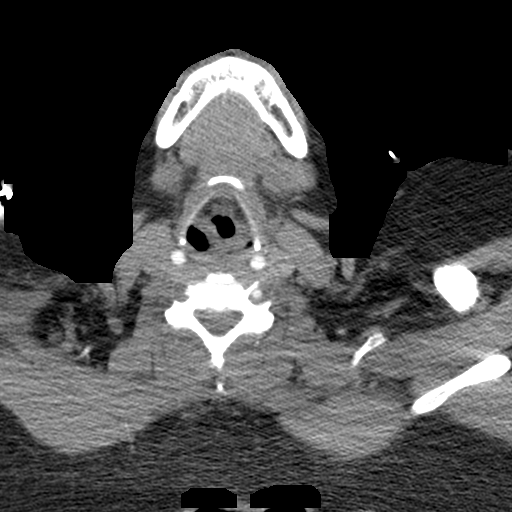
[im 70/158  bone]
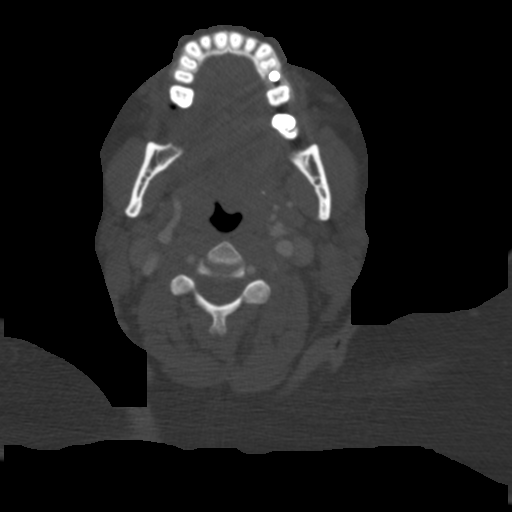
[im 88/158  soft-tissue]
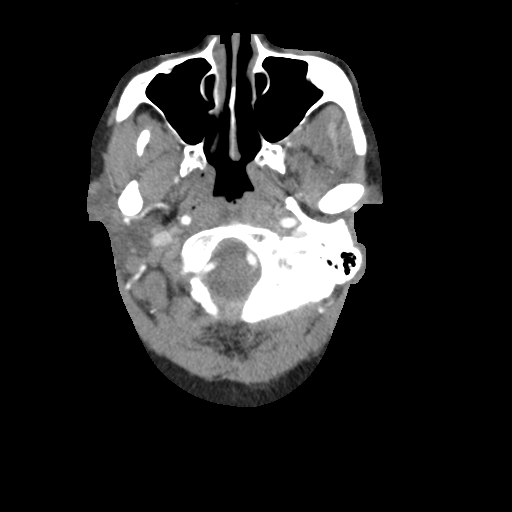
[im 105/158  bone]
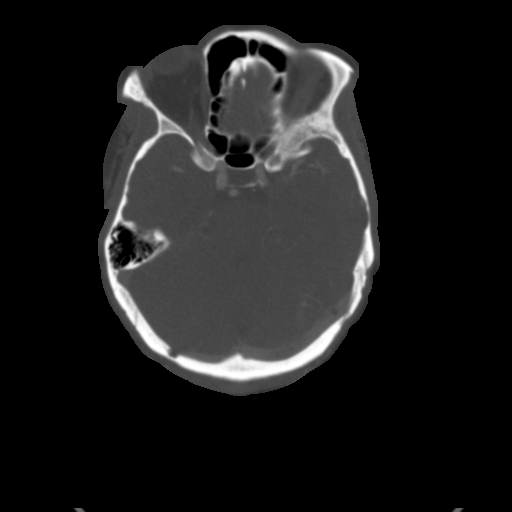
[im 123/158  soft-tissue]
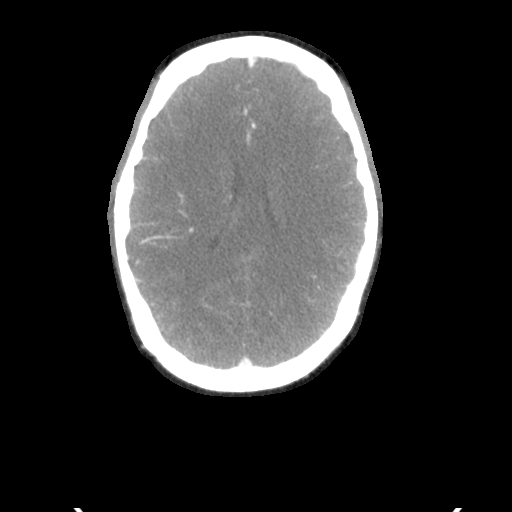
[im 140/158  bone]
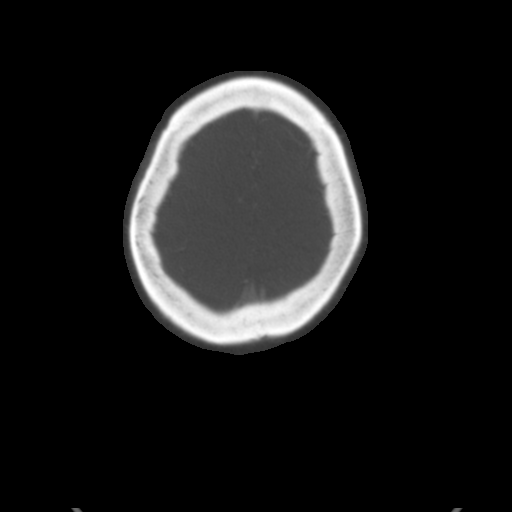

[Series 517: axial thick · axial · 0.53mm/px · z∈[-307,-202]mm · 2 of 65 slices shown]
[im 22/65  soft-tissue]
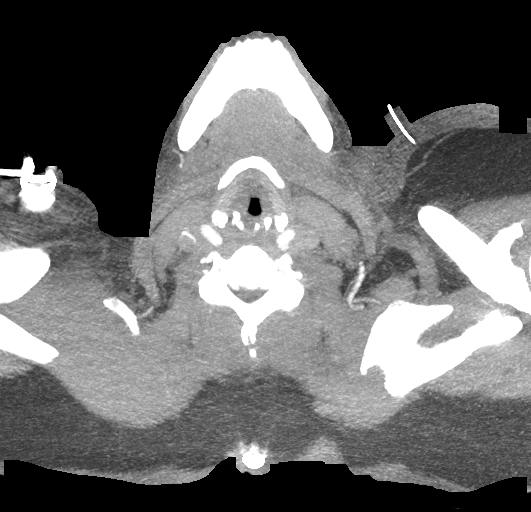
[im 43/65  soft-tissue]
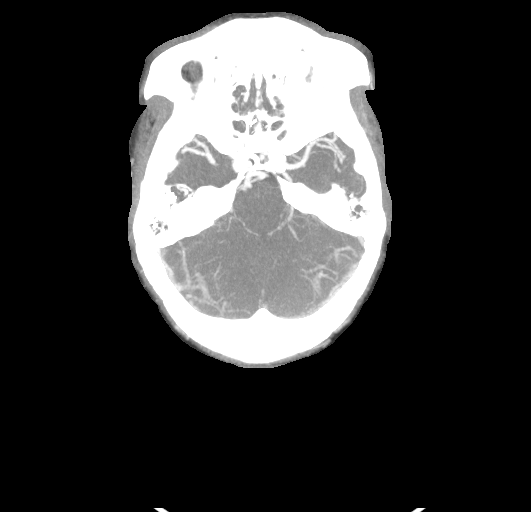

[10 of 20 positions shown; findings below may reference images not displayed]

FINDINGS: CTA NECK FINDINGS

Aortic arch: Known aortic dissection with involvement of the
brachiocephalic artery as previously seen. Poor assessment of the
distal brachiocephalic and proximal right subclavian and right
common carotid arteries due to streak artifact from venous contrast.
No involvement of the left common carotid or left subclavian artery
origins.

Right carotid system: Patent without evidence of stenosis or
dissection.

Left carotid system: Patent without evidence of stenosis or
dissection. Minimal calcified plaque at the carotid bifurcation.
Tortuous distal cervical ICA.

Vertebral arteries: Patent without evidence of stenosis or
dissection. Mildly dominant left vertebral artery.

Skeleton: No acute osseous abnormality or suspicious osseous lesion.

Other neck: No evidence of cervical lymphadenopathy or mass.

Upper chest: Clear lung apices.

Review of the MIP images confirms the above findings

CTA HEAD FINDINGS

Anterior circulation: The internal carotid arteries are patent from
skull base to carotid termini with mild atherosclerotic plaque
bilaterally not resulting in significant stenosis. ACAs and MCAs are
patent without evidence of a proximal branch occlusion or
significant proximal stenosis. No aneurysm is identified.

Posterior circulation: Intracranial vertebral arteries are widely
patent to the basilar. Patent left PICA, bilateral AICA, and
bilateral SCA origins are visualized. The basilar artery is widely
patent. There is a small right posterior communicating artery. Both
PCAs are patent without evidence of a significant proximal stenosis.
No aneurysm is identified.

Venous sinuses: Patent.

Anatomic variants: None.

Review of the MIP images confirms the above findings
IMPRESSION: 1. Known aortic dissection with involvement of the brachiocephalic
artery.
2. Widely patent cervical carotid and vertebral arteries without
dissection or stenosis.
3. Mild intracranial atherosclerosis without large vessel occlusion
or significant proximal stenosis.

## 2022-04-04 IMAGING — CT CT ANGIO NECK
3 of 7 series · 10 of 36 positions shown · IV contrast (APPLIED)
Comparison: CTA chest, abdomen, and pelvis 10/19/2019

CLINICAL DATA: Type A aortic dissection involving the
brachiocephalic artery.

EXAM:
CT ANGIOGRAPHY HEAD AND NECK
TECHNIQUE: Multidetector CT imaging of the head and neck was performed using
the standard protocol during bolus administration of intravenous
contrast. Multiplanar CT image reconstructions and MIPs were
obtained to evaluate the vascular anatomy. Carotid stenosis
measurements (when applicable) are obtained utilizing NASCET
criteria, using the distal internal carotid diameter as the
denominator.
CONTRAST:  75mL OMNIPAQUE IOHEXOL 350 MG/ML SOLN

[Series 5: cta head neck · axial · 0.49mm/px · z∈[-310,-206]mm · 2 of 158 slices shown]
[im 53/158  soft-tissue]
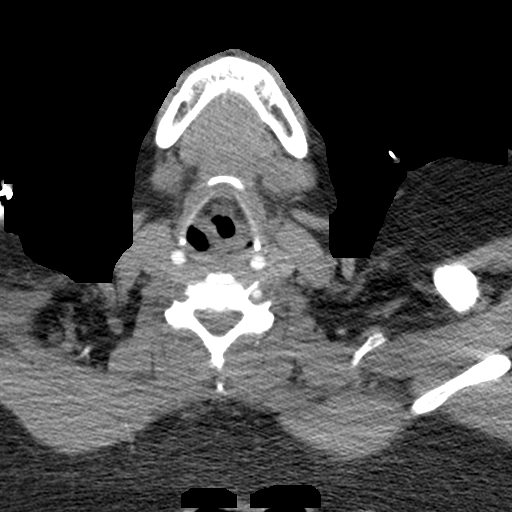
[im 105/158  soft-tissue]
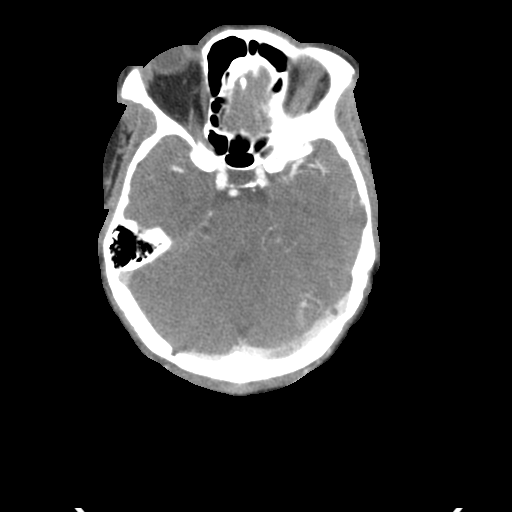

[Series 7: ax thin · axial · 0.53mm/px · z∈[-367,-138]mm · 6 of 321 slices shown]
[im 46/321  soft-tissue]
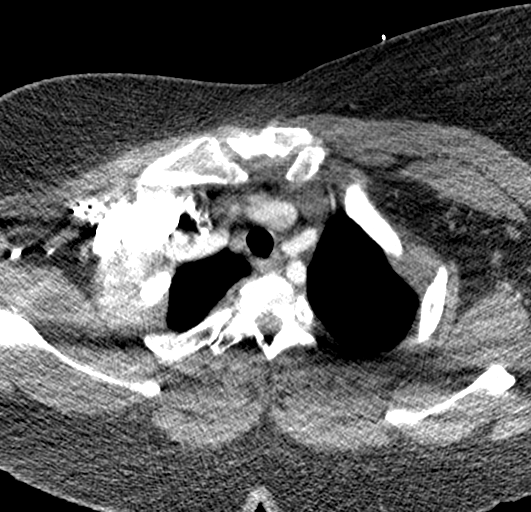
[im 92/321  bone]
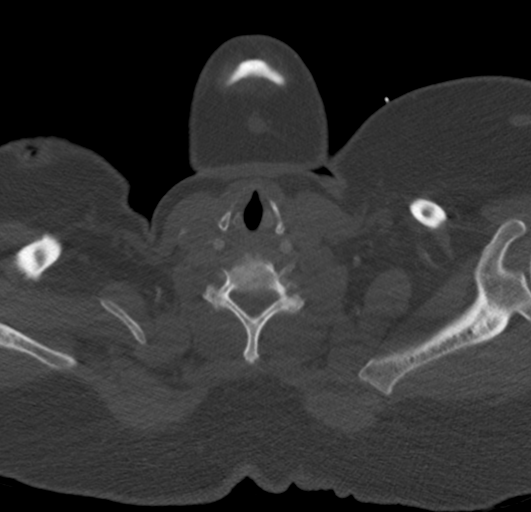
[im 138/321  soft-tissue]
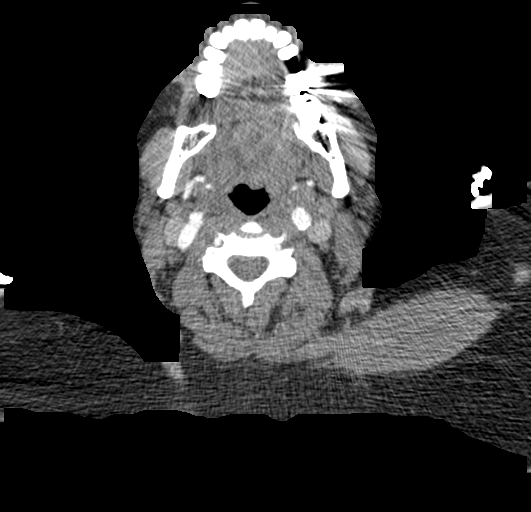
[im 183/321  bone]
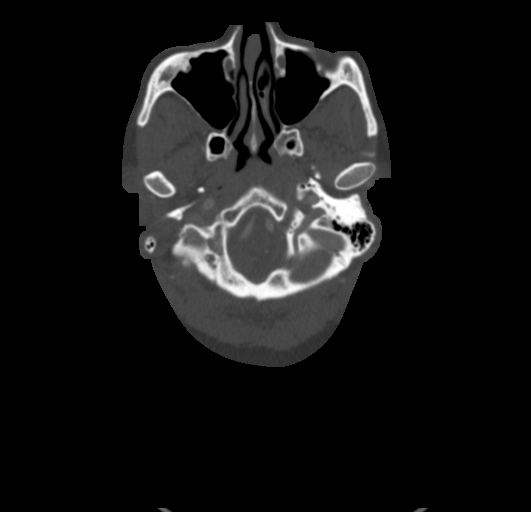
[im 229/321  soft-tissue]
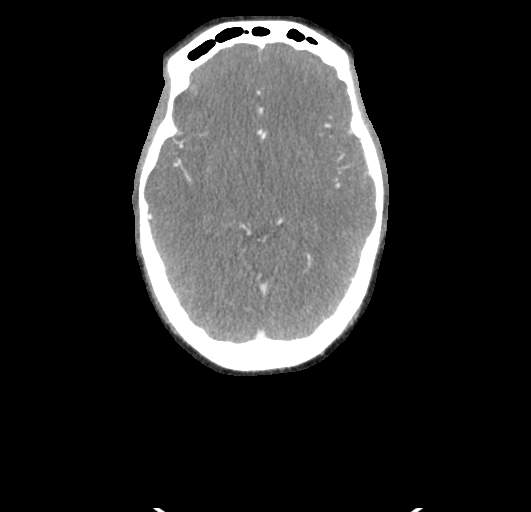
[im 275/321  bone]
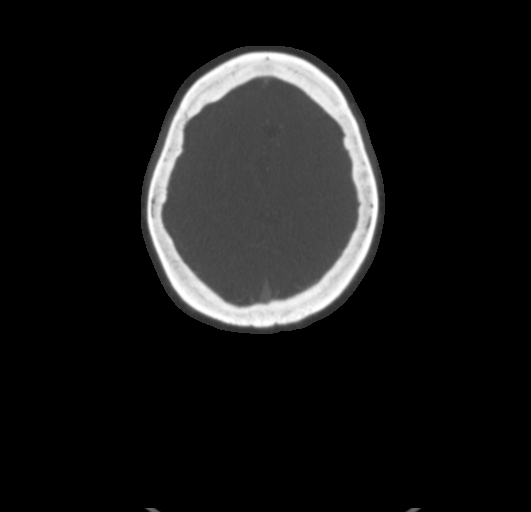

[Series 9: sagittal thin · sagittal · 0.53mm/px · 2 of 282 slices shown]
[im 21/282  soft-tissue]
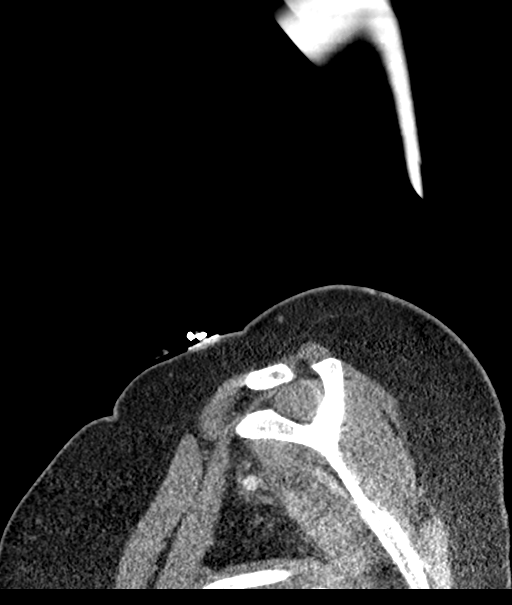
[im 262/282  soft-tissue]
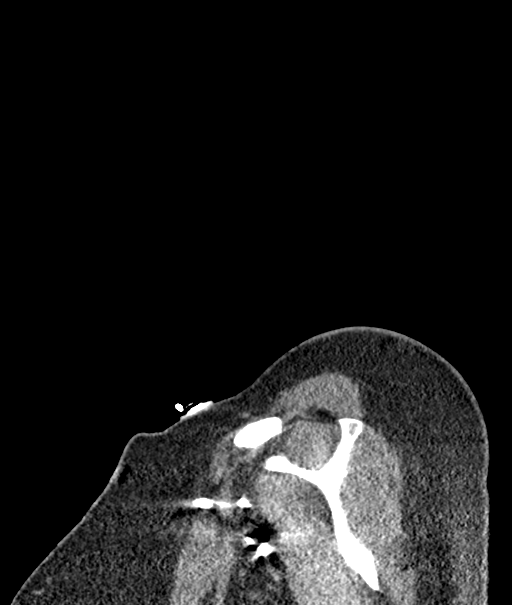

[10 of 36 positions shown; findings below may reference images not displayed]

FINDINGS: CTA NECK FINDINGS

Aortic arch: Known aortic dissection with involvement of the
brachiocephalic artery as previously seen. Poor assessment of the
distal brachiocephalic and proximal right subclavian and right
common carotid arteries due to streak artifact from venous contrast.
No involvement of the left common carotid or left subclavian artery
origins.

Right carotid system: Patent without evidence of stenosis or
dissection.

Left carotid system: Patent without evidence of stenosis or
dissection. Minimal calcified plaque at the carotid bifurcation.
Tortuous distal cervical ICA.

Vertebral arteries: Patent without evidence of stenosis or
dissection. Mildly dominant left vertebral artery.

Skeleton: No acute osseous abnormality or suspicious osseous lesion.

Other neck: No evidence of cervical lymphadenopathy or mass.

Upper chest: Clear lung apices.

Review of the MIP images confirms the above findings

CTA HEAD FINDINGS

Anterior circulation: The internal carotid arteries are patent from
skull base to carotid termini with mild atherosclerotic plaque
bilaterally not resulting in significant stenosis. ACAs and MCAs are
patent without evidence of a proximal branch occlusion or
significant proximal stenosis. No aneurysm is identified.

Posterior circulation: Intracranial vertebral arteries are widely
patent to the basilar. Patent left PICA, bilateral AICA, and
bilateral SCA origins are visualized. The basilar artery is widely
patent. There is a small right posterior communicating artery. Both
PCAs are patent without evidence of a significant proximal stenosis.
No aneurysm is identified.

Venous sinuses: Patent.

Anatomic variants: None.

Review of the MIP images confirms the above findings
IMPRESSION: 1. Known aortic dissection with involvement of the brachiocephalic
artery.
2. Widely patent cervical carotid and vertebral arteries without
dissection or stenosis.
3. Mild intracranial atherosclerosis without large vessel occlusion
or significant proximal stenosis.
